# Patient Record
Sex: Female | Born: 1990 | Race: White | Hispanic: No | Smoking: Never smoker
Health system: Southern US, Community
[De-identification: ages and names within clinical notes are randomized; demographics above are authoritative.]

## PROBLEM LIST (undated history)

## (undated) DIAGNOSIS — K219 Gastro-esophageal reflux disease without esophagitis: Secondary | ICD-10-CM

## (undated) DIAGNOSIS — O139 Gestational [pregnancy-induced] hypertension without significant proteinuria, unspecified trimester: Secondary | ICD-10-CM

## (undated) DIAGNOSIS — A749 Chlamydial infection, unspecified: Secondary | ICD-10-CM

## (undated) DIAGNOSIS — I10 Essential (primary) hypertension: Secondary | ICD-10-CM

## (undated) DIAGNOSIS — I8390 Asymptomatic varicose veins of unspecified lower extremity: Secondary | ICD-10-CM

## (undated) DIAGNOSIS — Z9889 Other specified postprocedural states: Secondary | ICD-10-CM

## (undated) DIAGNOSIS — R112 Nausea with vomiting, unspecified: Secondary | ICD-10-CM

## (undated) HISTORY — DX: Chlamydial infection, unspecified: A74.9

## (undated) HISTORY — DX: Asymptomatic varicose veins of unspecified lower extremity: I83.90

## (undated) HISTORY — PX: WISDOM TOOTH EXTRACTION: SHX21

## (undated) HISTORY — DX: Gastro-esophageal reflux disease without esophagitis: K21.9

---
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
## 2012-09-20 DIAGNOSIS — O139 Gestational [pregnancy-induced] hypertension without significant proteinuria, unspecified trimester: Secondary | ICD-10-CM

## 2015-11-12 ENCOUNTER — Encounter: Payer: Self-pay | Admitting: Vascular Surgery

## 2015-11-12 ENCOUNTER — Other Ambulatory Visit: Payer: Self-pay | Admitting: *Deleted

## 2015-11-12 DIAGNOSIS — I83811 Varicose veins of right lower extremities with pain: Secondary | ICD-10-CM

## 2015-12-04 ENCOUNTER — Encounter (HOSPITAL_COMMUNITY): Payer: Medicaid Other

## 2015-12-04 ENCOUNTER — Encounter: Payer: Medicaid Other | Admitting: Vascular Surgery

## 2015-12-14 ENCOUNTER — Encounter: Payer: Self-pay | Admitting: Vascular Surgery

## 2015-12-18 ENCOUNTER — Ambulatory Visit (INDEPENDENT_AMBULATORY_CARE_PROVIDER_SITE_OTHER): Payer: Medicaid Other | Admitting: Vascular Surgery

## 2015-12-18 ENCOUNTER — Ambulatory Visit (HOSPITAL_COMMUNITY)
Admission: RE | Admit: 2015-12-18 | Discharge: 2015-12-18 | Disposition: A | Discharge status: Home / Self Care | Payer: Medicaid Other | Source: Ambulatory Visit | Attending: Vascular Surgery | Admitting: Vascular Surgery

## 2015-12-18 ENCOUNTER — Encounter: Payer: Self-pay | Admitting: Vascular Surgery

## 2015-12-18 VITALS — BP 130/92 | HR 76 | Ht 64.0 in | Wt 226.7 lb

## 2015-12-18 DIAGNOSIS — I872 Venous insufficiency (chronic) (peripheral): Secondary | ICD-10-CM

## 2015-12-18 DIAGNOSIS — I83811 Varicose veins of right lower extremities with pain: Secondary | ICD-10-CM | POA: Insufficient documentation

## 2015-12-18 DIAGNOSIS — I83893 Varicose veins of bilateral lower extremities with other complications: Secondary | ICD-10-CM

## 2015-12-18 DIAGNOSIS — I83899 Varicose veins of unspecified lower extremities with other complications: Secondary | ICD-10-CM | POA: Insufficient documentation

## 2015-12-18 NOTE — Progress Notes (Signed)
[facility]  Patient name: Sheila Jenkins MRN: [id number] DOB: 1991/05/11 Sex: female  REASON FOR CONSULT: Varicose veins  HPI: Sheila GaudierMontana Busche is a 25 y.o. female, who who presents for evaluation of right varicose veins. She states that these veins started approximately 4 years ago after her first pregnancy. She has had 2 children. She states that she's never had issues like this prior to her pregnancies. He describes a burning and itching sensation over her right anterior thigh where she has a large varicosity. The patient also states that she feels a sensation of warmth to this area as well. The patient is concerned that these veins areworsening. These symptoms interfere with her daily work and activity. She complains that her right leg gives out sometimes. She denies any prior history of DVT. She denies a family history of varicose vein issues.  The patient has no past medical history. She has never been a smoker.  Past Medical History  Diagnosis Date  . Varicose veins    Family History: patient is unable to detail the medical history of his parents  SOCIAL HISTORY: Social History   Social History  . Marital Status: Single    Spouse Name: N/A  . Number of Children: N/A  . Years of Education: N/A   Occupational History  . Not on file.   Social History Main Topics  . Smoking status: Never Smoker   . Smokeless tobacco: Not on file  . Alcohol Use: No  . Drug Use: No  . Sexual Activity: Not on file   Other Topics Concern  . Not on file   Social History Narrative    No Known Allergies  No current outpatient prescriptions on file.   No current facility-administered medications for this visit.    REVIEW OF SYSTEMS:  [X]  denotes positive finding, [ ]  denotes negative finding Cardiac  Comments:  Chest pain or chest pressure:    Shortness of breath upon exertion:    Short of breath when lying flat:    Irregular heart rhythm:          Vascular    Pain in calf, thigh, or hip brought on by ambulation:    Pain in feet at night that wakes you up from your sleep:     Blood clot in your veins:    Leg swelling:         Pulmonary    Oxygen at home:    Productive cough:     Wheezing:         Neurologic    Sudden weakness in arms or legs:     Sudden numbness in arms or legs:     Sudden onset of difficulty speaking or slurred speech:    Temporary loss of vision in one eye:     Problems with dizziness:         Gastrointestinal    Blood in stool:     Vomited blood:         Genitourinary    Burning when urinating:     Blood in urine:        Psychiatric    Major depression:         Hematologic    Bleeding problems:    Problems with blood clotting too easily:        Skin    Rashes or ulcers:        Constitutional    Fever or chills:  PHYSICAL EXAM: Filed Vitals:   12/18/15 1406  BP: 130/92  Pulse: 76  Height:  (1.626 m)  Weight: 226 lb 11.2 oz (102.83 kg)  SpO2: 100%    GENERAL: The patient is a well-nourished obese female, in no acute distress. The vital signs are documented above. CARDIAC: There is a regular rate and rhythm.  VASCULAR: Palpable radial and pedal pulses bilaterally. Tortuous, palpable varicosity to right anterior thigh. PULMONARY: There is good air exchange bilaterally without wheezing or rales..  MUSCULOSKELETAL: There are no major deformities or cyanosis. NEUROLOGIC: No focal weakness or paresthesias are detected. SKIN: There are no ulcers or rashes noted. PSYCHIATRIC: The patient has a normal affect. HEAD: [place]/AT EAR/NOSE/THROAT: Hearing grossly intact, nares without erythema or drainage, oropharynx without Erythema/Exudate, Mallampati score: 3 LYMPH:  No Cervical, Axillary, or Inguinal lymphadenopathy     DATA:  Right lower extremity venous duplex evaluation 12/18/2015  There is no evidence of DVT or superficial thrombophlebitis.   Reflux present in right common  femoral vein. Reflux at the saphenofemoral junction. Anterior branch off of saphenofemoral junction with reflux present. Diameter of 0.49 cm distal thigh to 0.51cm proximal thigh.  MEDICAL ISSUES: Varicose veins with complications  The patient reports significant discomfort with her right leg varicose veins. Will recommend conservative treatment with 20-30 mmHg thigh-high compression stockings, elevation and ibuprofen. Patient understands the need for a 3 month trial of compression stockings. If the patient fails conservative therapy, we'll have the patient evaluated for possible laser ablation of the great saphenous vein and/or stab phlebectomy of right thigh varicosities. She will follow up in 3 months with Dr. Arbie Cookey or Dr. Hart Rochester.  Maris Berger, PA-C [facility] [phone]  Addendum  I have independently interviewed and examined the patient, and I agree with the physician assistant's findings.  Pt has ASV > 4.5 mm.  I doubt all of her sx are related to her CVI but she does have some elements of CVI.  Will trial compression stockings and then have her follow up in the Vein Clinic in 3 months for evaluation for EVLA R ASV vs stab plebectomy vs stripping  Leonides Sake, MD [facility] Specialists of [place] Office: [phone] Pager: [phone]  12/18/2015, 3:11 PM

## 2016-03-16 ENCOUNTER — Encounter: Payer: Self-pay | Admitting: Vascular Surgery

## 2016-03-22 ENCOUNTER — Ambulatory Visit: Payer: Medicaid Other | Admitting: Vascular Surgery

## 2017-03-23 ENCOUNTER — Emergency Department (HOSPITAL_COMMUNITY)
Admission: EM | Admit: 2017-03-23 | Discharge: 2017-03-23 | Disposition: A | Discharge status: Home / Self Care | Payer: Medicaid Other | Attending: Emergency Medicine | Admitting: Emergency Medicine

## 2017-03-23 ENCOUNTER — Encounter (HOSPITAL_COMMUNITY): Payer: Self-pay | Admitting: *Deleted

## 2017-03-23 ENCOUNTER — Emergency Department (HOSPITAL_COMMUNITY): Payer: Medicaid Other

## 2017-03-23 DIAGNOSIS — O034 Incomplete spontaneous abortion without complication: Secondary | ICD-10-CM | POA: Insufficient documentation

## 2017-03-23 DIAGNOSIS — N939 Abnormal uterine and vaginal bleeding, unspecified: Secondary | ICD-10-CM

## 2017-03-23 DIAGNOSIS — N9489 Other specified conditions associated with female genital organs and menstrual cycle: Secondary | ICD-10-CM | POA: Insufficient documentation

## 2017-03-23 DIAGNOSIS — Z3A01 Less than 8 weeks gestation of pregnancy: Secondary | ICD-10-CM | POA: Insufficient documentation

## 2017-03-23 DIAGNOSIS — O209 Hemorrhage in early pregnancy, unspecified: Secondary | ICD-10-CM | POA: Diagnosis present

## 2017-03-23 LAB — WET PREP, GENITAL
Sperm: NONE SEEN
TRICH WET PREP: NONE SEEN
YEAST WET PREP: NONE SEEN

## 2017-03-23 LAB — URINALYSIS, ROUTINE W REFLEX MICROSCOPIC
BACTERIA UA: NONE SEEN
Bilirubin Urine: NEGATIVE
Glucose, UA: NEGATIVE mg/dL
KETONES UR: 20 mg/dL — AB
Nitrite: NEGATIVE
PROTEIN: NEGATIVE mg/dL
Specific Gravity, Urine: 1.009 (ref 1.005–1.030)
pH: 5 (ref 5.0–8.0)

## 2017-03-23 LAB — HCG, QUANTITATIVE, PREGNANCY: hCG, Beta Chain, Quant, S: 1743 m[IU]/mL — ABNORMAL HIGH (ref <5)

## 2017-03-23 NOTE — ED Triage Notes (Signed)
Pt states she took a positive pregnancy test in the first part of June because she missed her period in April. Starting yesterday she had vaginal bleeding. She denies any pain. She has not been to an OBGYN.

## 2017-03-23 NOTE — ED Provider Notes (Signed)
[facility]-EMER[facility]ENCY DEPT Provider Note   CSN: [id number] Arrival date & time: 03/23/17  1256     History   Chief Complaint Chief Complaint  Patient presents with  . Vaginal Bleeding    HPI Sheila Jenkins is a 26 y.o. female.   Vaginal Bleeding  Primary symptoms include vaginal bleeding.  Primary symptoms include no discharge, no pelvic pain, no dysuria. There has been no fever. This is a new problem. The current episode started yesterday. The problem occurs constantly. The problem has not changed since onset.The symptoms occur during urination. She is pregnant. She has missed her period. Her LMP was months ago. Pertinent negatives include no anorexia and no diarrhea.    Past Medical History:  Diagnosis Date  . Varicose veins     Patient Active Problem List   Diagnosis Date Noted  . 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 Chronic venous insufficiency 12/18/2015    History reviewed. No pertinent surgical history.  OB History    No data available       Home Medications    Prior to Admission medications   Not on File    Family History No family history on file.  Social History Social History  Substance Use Topics  . Smoking status: Never Smoker  . Smokeless tobacco: Never Used  . Alcohol use No     Allergies   Patient has no known allergies.   Review of Systems Review of Systems  Gastrointestinal: Negative for anorexia and diarrhea.  Genitourinary: Positive for vaginal bleeding. Negative for dysuria and pelvic pain.  All other systems reviewed and are negative.    Physical Exam Updated Vital Signs BP (!) 145/87 (BP Location: Right Arm)   Pulse 70   Temp 98.3 F (36.8 C) (Oral)   Resp 18   Ht 5\' 4"  (1.626 m)   Wt 95.3 kg (210 lb)   LMP 01/13/2017   SpO2 96%   BMI 36.05 kg/m   Physical Exam  Constitutional: She is oriented to person, place, and time. She appears well-developed and well-nourished.  HENT:    Head: Normocephalic and atraumatic.  Eyes: Conjunctivae and EOM are normal.  Neck: Normal range of motion.  Cardiovascular: Normal rate and regular rhythm.   Pulmonary/Chest: Effort normal and breath sounds normal. No stridor. No respiratory distress.  Abdominal: Soft. Bowel sounds are normal. She exhibits no distension. There is no tenderness. There is no guarding.  Genitourinary: Right adnexum displays no mass and no tenderness. Left adnexum displays no mass and no tenderness. There is bleeding in the vagina.  Genitourinary Comments: Os closed Vaginal bleeding No discharge  Musculoskeletal: Normal range of motion. She exhibits no edema or deformity.  Neurological: She is alert and oriented to person, place, and time. No cranial nerve deficit. Coordination normal.  Skin: Skin is warm and dry.  Nursing note and vitals reviewed.    ED Treatments / Results  Labs (all labs ordered are listed, but only abnormal results are displayed) Labs Reviewed  WET PREP, GENITAL  HCG, QUANTITATIVE, PREGNANCY  URINALYSIS, ROUTINE W REFLEX MICROSCOPIC  GC/CHLAMYDIA PROBE AMP ([place]) NOT AT [facility]    EKG  EKG Interpretation None       Radiology No results found.  Procedures Procedures (including critical care time)  Medications Ordered in ED Medications - No data to display   Initial Impression / Assessment and Plan / ED Course  I have reviewed the triage vital signs and the nursing notes.  Pertinent labs & imaging results that were available during my care of the patient were reviewed by me and considered in my medical decision making (see chart for details).     26 yo F w/ likely incomplete abortion, but will follow up with ob for trending of hcg and repeat US.   Final Clinical Impressions(s) / ED Diagnoses   Final diagnoses:  Vaginal bleeding  Incomplete miscarriage    New Prescriptions There are no discharge medications for this patient.    Adah Stoneberg, Barbara Cower,  MD 03/25/17 [phone]

## 2017-03-24 LAB — GC/CHLAMYDIA PROBE AMP (~~LOC~~) NOT AT ARMC
Chlamydia: POSITIVE — AB
NEISSERIA GONORRHEA: NEGATIVE

## 2017-03-28 ENCOUNTER — Telehealth: Payer: Self-pay | Admitting: Obstetrics & Gynecology

## 2017-03-28 ENCOUNTER — Encounter: Payer: Self-pay | Admitting: Obstetrics & Gynecology

## 2017-03-28 ENCOUNTER — Ambulatory Visit (INDEPENDENT_AMBULATORY_CARE_PROVIDER_SITE_OTHER): Payer: Medicaid Other | Admitting: Obstetrics & Gynecology

## 2017-03-28 ENCOUNTER — Other Ambulatory Visit: Payer: Self-pay | Admitting: Obstetrics & Gynecology

## 2017-03-28 VITALS — BP 122/84 | HR 82 | Ht 64.0 in | Wt 210.0 lb

## 2017-03-28 DIAGNOSIS — O039 Complete or unspecified spontaneous abortion without complication: Secondary | ICD-10-CM | POA: Diagnosis not present

## 2017-03-28 MED ORDER — AZITHROMYCIN 500 MG PO TABS: 1000.0000 mg | ORAL_TABLET | Freq: Every day | ORAL | 1 refills | Status: DC

## 2017-03-28 NOTE — Telephone Encounter (Signed)
Patient came into the office today to see Dr. Despina HiddenEure, She states that Dr. Despina HiddenEure was suppose to prescribe her a medication for chlamydia for her and her partner. I looked in patient charts and it shows that he has sent it to [facility] but pt states they don't have it. Please resent medication.

## 2017-03-28 NOTE — Telephone Encounter (Signed)
Pt stated pharmacy did not receive the prescription that Dr. Despina HiddenEure had prescribed. I spoke with [facility] who stated that the prescription had been filled and picked up.

## 2017-03-28 NOTE — Progress Notes (Signed)
      Chief Complaint  Patient presents with  . Follow-up    ED visit; possible miscarriage    Blood pressure 122/84, pulse 82, height 5\' 4"  (1.626 m), weight 210 lb (95.3 kg), last menstrual period 01/13/2017.  26 y.o. No obstetric history on file. Patient's last menstrual period was 01/13/2017. The current method of family planning is none.  Outpatient Encounter Prescriptions as of 03/28/2017  Medication Sig  . azithromycin (ZITHROMAX) 500 MG tablet Take 2 tablets (1,000 mg total) by mouth daily.   No facility-administered encounter medications on file as of 03/28/2017.     Subjective Patient is seen in follow-up from emergency department Her last period was 01/13/2017 She presented to the ED with a positive pregnancy test and heavy vaginal bleeding like a heavy period with appropriate cramping Sonogram essentially reveals an empty uterus and no adnexal masses a very small sac in the lower uterine segment HCG was 1758 Since she was seen in the ED her bleeding is largely resolved as is her cramping and she's had no other passage of tissue that she is aware of  Objective   Pertinent ROS No burning with urination, frequency or urgency No nausea, vomiting or diarrhea Nor fever chills or other constitutional symptoms   Labs or studies Per HPI, from ED sonogram reviewed    Impression Diagnoses this Encounter::   ICD-10-CM   1. Spontaneous miscarriage O03.9 hCG, quantitative, pregnancy    Established relevant diagnosis(es):   Plan/Recommendations: Meds ordered this encounter  Medications  . azithromycin (ZITHROMAX) 500 MG tablet    Sig: Take 2 tablets (1,000 mg total) by mouth daily.    Dispense:  2 tablet    Refill:  1    Please provide refill for partner    Labs or Scans Ordered: Orders Placed This Encounter  Procedures  . hCG, quantitative, pregnancy    Management:: Repeat her hCG today just to make sure is following appropriately and give her a call  when that result returns in follow-up based on that result This likely is a completed spontaneous miscarriage all her questions were answered including future pregnancy issues  Follow up Return if symptoms worsen or fail to improve.        Face to face time:  15 minutes  Greater than 50% of the visit time was spent in counseling and coordination of care with the patient.  The summary and outline of the counseling and care coordination is summarized in the note above.   All questions were answered.  Past Medical History:  Diagnosis Date  . Varicose veins     Past Surgical History:  Procedure Laterality Date  . WISDOM TOOTH EXTRACTION      OB History    No data available      No Known Allergies  Social History   Social History  . Marital status: Single    Spouse name: N/A  . Number of children: N/A  . Years of education: N/A   Social History Main Topics  . Smoking status: Never Smoker  . Smokeless tobacco: Never Used  . Alcohol use No  . Drug use: No  . Sexual activity: Yes    Birth control/ protection: None   Other Topics Concern  . None   Social History Narrative  . None    Family History  Problem Relation Age of Onset  . Diabetes Maternal Grandmother   . Diabetes Maternal Grandfather

## 2017-03-29 LAB — BETA HCG QUANT (REF LAB): HCG QUANT: 88 m[IU]/mL

## 2017-05-19 ENCOUNTER — Telehealth: Payer: Self-pay | Admitting: Obstetrics & Gynecology

## 2017-05-19 ENCOUNTER — Encounter: Payer: Self-pay | Admitting: Obstetrics & Gynecology

## 2017-05-19 NOTE — Telephone Encounter (Signed)
Pt called stating that she had a miscarriage at the end of June and has not started her period yet. Took a home pregnancy test and it was negative. Advised pt to give it a little more time and to call us back if she had not started a period after 3 months. Pt verbalized understanding.

## 2017-07-18 ENCOUNTER — Telehealth: Payer: Self-pay | Admitting: *Deleted

## 2017-07-18 NOTE — Telephone Encounter (Signed)
Spoke with pt. Pt had a miscarriage in June. She started bleeding 9/11. Stopped then started bleeding again 9/26 for 1 week. On 10/20, she started bleeding heavy and passing a mucus discharge. Also having cramps. I advised she needs to be seen. Pt voiced understanding and call was transferred to front desk for appt. JSY

## 2017-07-19 ENCOUNTER — Encounter: Payer: Self-pay | Admitting: Adult Health

## 2017-07-19 ENCOUNTER — Ambulatory Visit (INDEPENDENT_AMBULATORY_CARE_PROVIDER_SITE_OTHER): Payer: Medicaid Other | Admitting: Adult Health

## 2017-07-19 VITALS — BP 136/84 | HR 87 | Ht 64.0 in | Wt 211.0 lb

## 2017-07-19 DIAGNOSIS — R109 Unspecified abdominal pain: Secondary | ICD-10-CM

## 2017-07-19 DIAGNOSIS — Z8759 Personal history of other complications of pregnancy, childbirth and the puerperium: Secondary | ICD-10-CM

## 2017-07-19 DIAGNOSIS — N921 Excessive and frequent menstruation with irregular cycle: Secondary | ICD-10-CM

## 2017-07-19 DIAGNOSIS — Z8619 Personal history of other infectious and parasitic diseases: Secondary | ICD-10-CM | POA: Diagnosis not present

## 2017-07-19 LAB — POCT URINALYSIS DIPSTICK
Leukocytes, UA: NEGATIVE
NITRITE UA: NEGATIVE
RBC UA: POSITIVE

## 2017-07-19 LAB — POCT URINE PREGNANCY: Preg Test, Ur: NEGATIVE

## 2017-07-19 MED ORDER — DOXYCYCLINE HYCLATE 100 MG PO TABS
100.0000 mg | ORAL_TABLET | Freq: Two times a day (BID) | ORAL | 0 refills | Status: DC
Start: 2017-07-19 — End: 2017-08-21

## 2017-07-19 NOTE — Progress Notes (Signed)
Subjective:     Patient ID: Sheila Jenkins, female   DOB: 12/07/1990, 26 y.o.   MRN: [id number]  HPI Sheila Jenkins is a 26 year old white female, in complaining of heavy bleeding and mucous discharge since 10/20.She also complains of headache almost daily since 10/20 and decreased appetite and breast tenderness and is tired. She had miscarriage end of June and was also treated for chlamydia in July. She had bleeding 9/11 and again 9/26 for about a week each time. And has cramps.She is sexually active with one partner and desires to be pregnant. QHCG was 88 on 03/28/17.  Review of Systems Bleeding heavy with mucous discharge +breast tenderness +headache almost daily since 10/20 +tired +cramps  Decreased appetite  Reviewed past medical,surgical, social and family history. Reviewed medications and allergies.      Objective:   Physical Exam BP 136/84 (BP Location: Right Arm, Patient Position: Sitting, Cuff Size: Normal)   Pulse 87   Ht 5' 4" (1.626 m)   Wt 211 lb (95.7 kg)   LMP 07/15/2017   BMI 36.22 kg/m UPT negative, urine dipstick +blood trace protein, Skin warm and dry. Neck: mid line trachea, normal thyroid, good ROM, no lymphadenopathy noted. Lungs: clear to ausculation bilaterally. Cardiovascular: regular rate and rhythm. .Pelvic: external genitalia is normal in appearance no lesions, vagina: period like blood,urethra has no lesions or masses noted, cervix:smooth and bulbous,no CMT, uterus: normal size, shape and contour, mildly tender, no masses felt, adnexa: no masses or tenderness noted. Bladder is non tender and no masses felt. GC/CHL obtained.     Assessment:     1. Menorrhagia with irregular cycle   2. Abdominal cramps   3. History of miscarriage   4. History of chlamydia       Plan:    Ok to take tylenol or advil for cramps  Meds ordered this encounter  Medications  . doxycycline (VIBRA-TABS) 100 MG tablet    Sig: Take 1 tablet (100 mg total) by mouth 2 (two) times  daily.    Dispense:  28 tablet    Refill:  0    Order Specific Question:   Supervising Provider    Answer:   Tania Ade H [[id number]]  Check CBC,CMP,TSH,ESR and QHCG GC/CHL sent Follow up in 2 days

## 2017-07-20 LAB — COMPREHENSIVE METABOLIC PANEL
ALBUMIN: 4.7 g/dL (ref 3.5–5.5)
ALK PHOS: 51 IU/L (ref 39–117)
ALT: 16 IU/L (ref 0–32)
AST: 14 IU/L (ref 0–40)
Albumin/Globulin Ratio: 1.8 (ref 1.2–2.2)
BUN / CREAT RATIO: 17 (ref 9–23)
BUN: 11 mg/dL (ref 6–20)
Bilirubin Total: 0.3 mg/dL (ref 0.0–1.2)
CO2: 25 mmol/L (ref 20–29)
CREATININE: 0.66 mg/dL (ref 0.57–1.00)
Calcium: 9.3 mg/dL (ref 8.7–10.2)
Chloride: 104 mmol/L (ref 96–106)
GFR calc Af Amer: 143 mL/min/{1.73_m2} (ref >59)
GFR, EST NON AFRICAN AMERICAN: 124 mL/min/{1.73_m2} (ref >59)
GLOBULIN, TOTAL: 2.6 g/dL (ref 1.5–4.5)
GLUCOSE: 88 mg/dL (ref 65–99)
Potassium: 4.5 mmol/L (ref 3.5–5.2)
Sodium: 141 mmol/L (ref 134–144)
TOTAL PROTEIN: 7.3 g/dL (ref 6.0–8.5)

## 2017-07-20 LAB — CBC
Hematocrit: 39.6 % (ref 34.0–46.6)
Hemoglobin: 12.6 g/dL (ref 11.1–15.9)
MCH: 28.9 pg (ref 26.6–33.0)
MCHC: 31.8 g/dL (ref 31.5–35.7)
MCV: 91 fL (ref 79–97)
PLATELETS: 371 10*3/uL (ref 150–379)
RBC: 4.36 x10E6/uL (ref 3.77–5.28)
RDW: 12.9 % (ref 12.3–15.4)
WBC: 4.9 10*3/uL (ref 3.4–10.8)

## 2017-07-20 LAB — BETA HCG QUANT (REF LAB)

## 2017-07-20 LAB — TSH: TSH: 1.36 u[IU]/mL (ref 0.450–4.500)

## 2017-07-20 LAB — SEDIMENTATION RATE: Sed Rate: 2 mm/hr (ref 0–32)

## 2017-07-21 ENCOUNTER — Encounter: Payer: Self-pay | Admitting: Adult Health

## 2017-07-21 ENCOUNTER — Ambulatory Visit (INDEPENDENT_AMBULATORY_CARE_PROVIDER_SITE_OTHER): Payer: Medicaid Other | Admitting: Adult Health

## 2017-07-21 VITALS — BP 128/82 | HR 71 | Ht 64.0 in | Wt 212.0 lb

## 2017-07-21 DIAGNOSIS — R109 Unspecified abdominal pain: Secondary | ICD-10-CM

## 2017-07-21 NOTE — Patient Instructions (Signed)
F/u in 4 weeks for pap and physical

## 2017-07-21 NOTE — Progress Notes (Signed)
Subjective:     Patient ID: Sheila GaudierMontana Jenkins, female   DOB: 16-Dec-1990, 26 y.o.   MRN: [id number]  HPI Sheila Jenkins is  26 year old white female back in follow up io being seen 2 days ago for  Heavy bleeding and camps.Labs were all normal and she says bleeding stopped and no cramps today and feels so much better.   Review of Systems Bleeding stopped Cramping much better, none today Teary at times Has trouble sleeping Reviewed past medical,surgical, social and family history. Reviewed medications and allergies.     Objective:   Physical Exam BP 128/82 (BP Location: Right Arm, Patient Position: Sitting, Cuff Size: Normal)   Pulse 71   Ht 5\' 4"  (1.626 m)   Wt 212 lb (96.2 kg)   LMP 07/15/2017   BMI 36.39 kg/m Skin warm and dry, abdomen is soft and non tender and she is smiling today. PHQ 2 score 0.    Assessment:     Abdominal cramping much better     Plan:     Finish doxycycline Ok to use melatonin  Return in 4 weeks for pap and physical

## 2017-07-23 LAB — GC/CHLAMYDIA PROBE AMP
CHLAMYDIA, DNA PROBE: NEGATIVE
NEISSERIA GONORRHOEAE BY PCR: NEGATIVE

## 2017-08-21 ENCOUNTER — Encounter: Payer: Self-pay | Admitting: Adult Health

## 2017-08-21 ENCOUNTER — Other Ambulatory Visit (HOSPITAL_COMMUNITY)
Admission: RE | Admit: 2017-08-21 | Discharge: 2017-08-21 | Disposition: A | Discharge status: Home / Self Care | Payer: Medicaid Other | Source: Ambulatory Visit | Attending: Adult Health | Admitting: Adult Health

## 2017-08-21 ENCOUNTER — Ambulatory Visit: Payer: Medicaid Other | Admitting: Adult Health

## 2017-08-21 VITALS — BP 126/90 | HR 70 | Ht 64.0 in | Wt 212.0 lb

## 2017-08-21 DIAGNOSIS — Z0001 Encounter for general adult medical examination with abnormal findings: Secondary | ICD-10-CM | POA: Diagnosis not present

## 2017-08-21 DIAGNOSIS — Z01419 Encounter for gynecological examination (general) (routine) without abnormal findings: Secondary | ICD-10-CM | POA: Insufficient documentation

## 2017-08-21 DIAGNOSIS — Z3202 Encounter for pregnancy test, result negative: Secondary | ICD-10-CM

## 2017-08-21 DIAGNOSIS — N926 Irregular menstruation, unspecified: Secondary | ICD-10-CM

## 2017-08-21 DIAGNOSIS — Z01411 Encounter for gynecological examination (general) (routine) with abnormal findings: Secondary | ICD-10-CM

## 2017-08-21 DIAGNOSIS — R5383 Other fatigue: Secondary | ICD-10-CM | POA: Diagnosis not present

## 2017-08-21 LAB — POCT URINE PREGNANCY: PREG TEST UR: NEGATIVE

## 2017-08-21 NOTE — Progress Notes (Signed)
Patient ID: Sheila Jenkins, female   DOB: [birth year], 26 y.o.   MRN: [id number] History of Present Illness: Sheila Jenkins is a 26 year old white female in for a well woman gyn exam and pap.She has missed a period and feels tired with some breast tenderness.She had normal labs in October. She says she is working out more.    Current Medications, Allergies, Past Medical History, Past Surgical History, Family History and Social History were reviewed in [facility] electronic medical record.     Review of Systems:  Patient denies any headaches, hearing loss,  blurred vision, shortness of breath, chest pain, abdominal pain, problems with bowel movements, urination, or intercourse. No joint pain or mood swings. See HPI for positives.   Physical Exam:BP 126/90 (BP Location: Left Arm, Patient Position: Sitting, Cuff Size: Large)   Pulse 70   Ht 5\' 4"  (1.626 m)   Wt 212 lb (96.2 kg)   LMP 07/16/2017   BMI 36.39 kg/m  General:  Well developed, well nourished, no acute distress Skin:  Warm and dry Neck:  Midline trachea, normal thyroid, good ROM, no lymphadenopathy Lungs; Clear to auscultation bilaterally Breast:  No dominant palpable mass, retraction, or nipple discharge Cardiovascular: Regular rate and rhythm Abdomen:  Soft, non tender, no hepatosplenomegaly Pelvic:  External genitalia is normal in appearance, no lesions.  The vagina is normal in appearance. Urethra has no lesions or masses. The cervix is bulbous. Thin prep pap with reflex HPV and GC/Chlamydia performed. Uterus is felt to be normal size, shape, and contour.  No adnexal masses or tenderness noted.Bladder is non tender, no masses felt. Extremities/musculoskeletal:  No swelling or varicosities noted, no clubbing or cyanosis Psych:  No mood changes, alert and cooperative,seems happy PHQ 2 score 0.  Impression:  1. Well woman exam with routine gynecological exam   2. Missed period   3. Tired   4. Pregnancy examination or test,  negative result      Plan: Take PNV with folic acid Call with next period, will check progesterone level, or if no period call Physical in 1 year Pap in 3 if normal

## 2017-08-22 LAB — CYTOLOGY - PAP
Chlamydia: NEGATIVE
Diagnosis: NEGATIVE
NEISSERIA GONORRHEA: NEGATIVE

## 2017-08-31 ENCOUNTER — Ambulatory Visit: Payer: Medicaid Other | Admitting: Adult Health

## 2017-08-31 ENCOUNTER — Encounter: Payer: Self-pay | Admitting: Adult Health

## 2017-08-31 VITALS — BP 130/90 | HR 80 | Ht 63.0 in | Wt 212.0 lb

## 2017-08-31 DIAGNOSIS — Z3169 Encounter for other general counseling and advice on procreation: Secondary | ICD-10-CM | POA: Diagnosis not present

## 2017-08-31 DIAGNOSIS — Z319 Encounter for procreative management, unspecified: Secondary | ICD-10-CM

## 2017-08-31 DIAGNOSIS — N946 Dysmenorrhea, unspecified: Secondary | ICD-10-CM | POA: Diagnosis not present

## 2017-08-31 LAB — POCT URINE PREGNANCY: Preg Test, Ur: NEGATIVE

## 2017-08-31 MED ORDER — IBUPROFEN 800 MG PO TABS: 800.0000 mg | ORAL_TABLET | Freq: Three times a day (TID) | ORAL | 1 refills | Status: DC | PRN

## 2017-08-31 MED ORDER — PRENATAL PLUS 27-1 MG PO TABS: 1.0000 | ORAL_TABLET | Freq: Every day | ORAL | 12 refills | Status: DC

## 2017-08-31 NOTE — Progress Notes (Signed)
Subjective:     Patient ID: Sheila GaudierMontana Jenkins, female   DOB: January 21, 1991, 26 y.o.   MRN: [id number]  HPI Sheila Jenkins is a 26 year old white female in complaining of cramps and clots, missed November period.She would like to be pregnant, she had miscarriage in June.  Review of Systems +cramps +clots Reviewed past medical,surgical, social and family history. Reviewed medications and allergies.     Objective:   Physical Exam BP 130/90 (BP Location: Left Arm, Patient Position: Sitting, Cuff Size: Large)   Pulse 80   Ht 5\' 3"  (1.6 m)   Wt 212 lb (96.2 kg)   LMP 08/30/2017   BMI 37.55 kg/m UPT negative, Skin warm and dry.Pelvic: external genitalia is normal in appearance no lesions, vagina: +period blood,urethra has no lesions or masses noted, cervix:smooth and bulbous, uterus: normal size, shape and contour,mildly tender, no masses felt, adnexa: no masses or tenderness noted. Bladder is non tender and no masses felt.     Assessment:     1. Menstrual cramps   2. Patient desires pregnancy       Plan:     Meds ordered this encounter  Medications  . prenatal vitamin w/FE, FA (PRENATAL 1 + 1) 27-1 MG TABS tablet    Sig: Take 1 tablet by mouth daily at 12 noon.    Dispense:  30 each    Refill:  12    Order Specific Question:   Supervising Provider    Answer:   Despina HiddenEURE, LUTHER H [[id number]]  . ibuprofen (ADVIL,MOTRIN) 800 MG tablet    Sig: Take 1 tablet (800 mg total) by mouth every 8 (eight) hours as needed.    Dispense:  60 tablet    Refill:  1    Order Specific Question:   Supervising Provider    Answer:   Lazaro ArmsEURE, LUTHER H [[id number]]  Check progesterone level 12/26 F/U prn

## 2017-11-09 ENCOUNTER — Other Ambulatory Visit: Payer: Self-pay | Admitting: *Deleted

## 2017-11-09 DIAGNOSIS — I83811 Varicose veins of right lower extremities with pain: Secondary | ICD-10-CM

## 2017-11-24 HISTORY — PX: VARICOSE VEIN SURGERY: SHX832

## 2017-12-12 ENCOUNTER — Ambulatory Visit (INDEPENDENT_AMBULATORY_CARE_PROVIDER_SITE_OTHER): Payer: Medicaid Other | Admitting: Vascular Surgery

## 2017-12-12 ENCOUNTER — Encounter: Payer: Self-pay | Admitting: Vascular Surgery

## 2017-12-12 ENCOUNTER — Other Ambulatory Visit: Payer: Self-pay | Admitting: *Deleted

## 2017-12-12 ENCOUNTER — Ambulatory Visit (HOSPITAL_COMMUNITY)
Admission: RE | Admit: 2017-12-12 | Discharge: 2017-12-12 | Disposition: A | Discharge status: Home / Self Care | Payer: Medicaid Other | Source: Ambulatory Visit | Attending: Vascular Surgery | Admitting: Vascular Surgery

## 2017-12-12 VITALS — BP 111/76 | HR 63 | Temp 97.7°F | Resp 16 | Ht 64.0 in | Wt 202.0 lb

## 2017-12-12 DIAGNOSIS — I872 Venous insufficiency (chronic) (peripheral): Secondary | ICD-10-CM

## 2017-12-12 DIAGNOSIS — I83811 Varicose veins of right lower extremities with pain: Secondary | ICD-10-CM | POA: Diagnosis not present

## 2017-12-12 DIAGNOSIS — R609 Edema, unspecified: Secondary | ICD-10-CM | POA: Diagnosis present

## 2017-12-12 DIAGNOSIS — I83893 Varicose veins of bilateral lower extremities with other complications: Secondary | ICD-10-CM | POA: Diagnosis not present

## 2017-12-12 NOTE — Progress Notes (Signed)
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 female here today for continued follow-up.  She has been extremely compliant with thigh-high graduated compression garments.  This initially was improving satisfactory for alleviating the pain that she has over the large varicosities over her anterior thigh and lateral calf.  She reports that she is now having progressive discomfort despite elevation compression and ibuprofen use.  Makes her difficult for her to care for her child.  She is here today for further evaluation and discussion of treatment.  She has had no history of DVT  Past Medical History:  Diagnosis Date  . Varicose veins     Family History  Problem Relation Age of Onset  . Diabetes Maternal Grandmother   . Diabetes Maternal Grandfather     SOCIAL HISTORY: Social History   Tobacco Use  . Smoking status: Never Smoker  . Smokeless tobacco: Never Used  Substance Use Topics  . Alcohol use: No    Alcohol/week: 0.0 oz    No Known Allergies  Current Outpatient Medications  Medication Sig Dispense Refill  . ibuprofen (ADVIL,MOTRIN) 800 MG tablet Take 1 tablet (800 mg total) by mouth every 8 (eight) hours as needed. (Patient not taking: Reported on 12/12/2017) 60 tablet 1  . prenatal vitamin w/FE, FA (PRENATAL 1 + 1) 27-1 MG TABS tablet Take 1 tablet by mouth daily at 12 noon. (Patient not taking: Reported on 12/12/2017) 30 each 12   No current facility-administered medications for this visit.     REVIEW OF SYSTEMS:  [X]  denotes positive finding, [ ]  denotes negative finding Cardiac  Comments:  Chest pain or chest pressure:    Shortness of breath upon exertion:    Short of breath when lying flat:    Irregular heart rhythm:        Vascular      Pain in calf, thigh, or hip brought on by ambulation:    Pain in feet at night that wakes you up from your sleep:     Blood clot in your veins:    Leg swelling:  x         PHYSICAL EXAM: Vitals:   12/12/17 1138  BP: 111/76  Pulse: 63  Resp: 16  Temp: 97.7 F (36.5 C)  SpO2: 100%  Weight: 202 lb (91.6 kg)  Height: 5\' 4"  (1.626 m)    GENERAL: The patient is a well-nourished female, in no acute distress. The vital signs are documented above. CARDIOVASCULAR: 2+ dorsalis pedis pulses bilaterally.  No varicosities in her left leg.  She has large varicosities extending from the medial upper thigh across her anterior thigh and down the lateral knee and calf. PULMONARY: There is good air exchange  MUSCULOSKELETAL: There are no major deformities or cyanosis. NEUROLOGIC: No focal weakness or paresthesias are detected. SKIN: There are no ulcers or rashes noted. PSYCHIATRIC: The patient has a normal affect.  DATA:  Reflux in her anterior accessory saphenous vein on the right with dilatation and extension into the large varicosities extending throughout her thigh.  I imaged her anterior accessory branch of her great saphenous vein with SonoSite ultrasound.  This is enlarged throughout its course and does have a very straight path out to the mid thigh.  From there it does feed into these large varicosities extending through the rest of  her leg  MEDICAL ISSUES: The patient has been very diligent in her conservative treatment and this is initially successful but is now failed to alleviate her symptoms.  I have recommended laser ablation of her accessory saphenous vein on the right and stab phlebectomy of greater than 20 tributary varicosities throughout the remaining portion of her thigh.  Explained that she would have to have compression garment usage for 48 hours around-the-clock following the procedure and then daytime only for 2 weeks.  I did explain the very slight risk of DVT associated with  the procedure.  She understands and wishes to proceed for symptom relief.    Larina Earthly, MD FACS [facility] Tel [phone] Pager [phone]

## 2017-12-21 ENCOUNTER — Ambulatory Visit: Payer: Medicaid Other | Admitting: Vascular Surgery

## 2017-12-21 ENCOUNTER — Encounter: Payer: Self-pay | Admitting: Vascular Surgery

## 2017-12-21 VITALS — BP 126/89 | HR 64 | Temp 98.0°F | Resp 16 | Ht 64.0 in | Wt 199.0 lb

## 2017-12-21 DIAGNOSIS — I83811 Varicose veins of right lower extremities with pain: Secondary | ICD-10-CM | POA: Diagnosis not present

## 2017-12-21 NOTE — Progress Notes (Signed)
     Laser Ablation Procedure    Date: 12/21/2017   [facility]12-13-1990  Consent signed: Yes    Surgeon:  Dr. Tawanna Coolerodd Kerri-Anne Haeberle  Procedure: Laser Ablation: right Greater Saphenous Vein  BP 126/89   Pulse 64   Temp 98 F (36.7 C)   Resp 16   Ht 5\' 4"  (1.626 m)   Wt 199 lb (90.3 kg)   SpO2 99%   BMI 34.16 kg/m   Tumescent Anesthesia: 400 cc 0.9% NaCl with 50 cc Lidocaine HCL with 1% Epi and 15 cc 8.4% NaHCO3  Local Anesthesia: 5 cc Lidocaine HCL and NaHCO3 (ratio 2:1)  15 watts continuous mode        Total energy: 735   Total time: :48    Stab Phlebectomy: >20 Sites: Thigh and Calf  Patient tolerated procedure well  Notes:   Description of Procedure:  After marking the course of the secondary varicosities, the patient was placed on the operating table in the supine position, and the right leg was prepped and draped in sterile fashion.   Local anesthetic was administered and under ultrasound guidance the saphenous vein was accessed with a micro needle and guide wire; then the mirco puncture sheath was placed.  A guide wire was inserted saphenofemoral junction , followed by a 5 french sheath.  The position of the sheath and then the laser fiber below the junction was confirmed using the ultrasound.  Tumescent anesthesia was administered along the course of the saphenous vein using ultrasound guidance. The patient was placed in Trendelenburg position and protective laser glasses were placed on patient and staff, and the laser was fired at 15 watts continuous mode advancing 1-202mm/second for a total of 735 joules.   For stab phlebectomies, local anesthetic was administered at the previously marked varicosities, and tumescent anesthesia was administered around the vessels.  Greater than 20 stab wounds were made using the tip of an 11 blade. And using the vein hook, the phlebectomies were performed using a hemostat to avulse the varicosities.  Adequate hemostasis was achieved.      Steri strips were applied to the stab wounds and ABD pads and thigh high compression stockings were applied.  Ace wrap bandages were applied over the phlebectomy sites and at the top of the saphenofemoral junction. Blood loss was less than 15 cc.  The patient ambulated out of the operating room having tolerated the procedure well.  Ablation of her anterior accessory great saphenous vein from her proximal thigh to saphenous femoral junction.  Stab phlebectomy of multiple varicosities throughout her thigh and calf.  Follow-up 1 week

## 2017-12-22 ENCOUNTER — Encounter: Payer: Self-pay | Admitting: Vascular Surgery

## 2017-12-28 ENCOUNTER — Other Ambulatory Visit: Payer: Self-pay

## 2017-12-28 ENCOUNTER — Encounter: Payer: Self-pay | Admitting: Vascular Surgery

## 2017-12-28 ENCOUNTER — Ambulatory Visit (HOSPITAL_COMMUNITY)
Admission: RE | Admit: 2017-12-28 | Discharge: 2017-12-28 | Disposition: A | Discharge status: Home / Self Care | Payer: Medicaid Other | Source: Ambulatory Visit | Attending: Vascular Surgery | Admitting: Vascular Surgery

## 2017-12-28 ENCOUNTER — Ambulatory Visit (INDEPENDENT_AMBULATORY_CARE_PROVIDER_SITE_OTHER): Payer: Medicaid Other | Admitting: Vascular Surgery

## 2017-12-28 VITALS — BP 130/86 | HR 68 | Resp 18 | Ht 64.0 in | Wt 199.0 lb

## 2017-12-28 DIAGNOSIS — I83811 Varicose veins of right lower extremities with pain: Secondary | ICD-10-CM | POA: Insufficient documentation

## 2017-12-28 DIAGNOSIS — Z9889 Other specified postprocedural states: Secondary | ICD-10-CM | POA: Insufficient documentation

## 2017-12-28 NOTE — Progress Notes (Signed)
   Patient name: Sheila Jenkins MRN: [id number] DOB: 07/06/1991 Sex: female  REASON FOR VISIT: Follow-up laser ablation right great saphenous vein and stab phlebectomy of multiple tributary varicosities over the thigh and calf  HPI: Sheila Jenkins is a 27 y.o. female here today for follow-up.  She has been compliant with her compression.  Reports minimal discomfort associated with this.  Current Outpatient Medications  Medication Sig Dispense Refill  . ibuprofen (ADVIL,MOTRIN) 800 MG tablet Take 1 tablet (800 mg total) by mouth every 8 (eight) hours as needed. (Patient not taking: Reported on 12/12/2017) 60 tablet 1  . prenatal vitamin w/FE, FA (PRENATAL 1 + 1) 27-1 MG TABS tablet Take 1 tablet by mouth daily at 12 noon. (Patient not taking: Reported on 12/12/2017) 30 each 12   No current facility-administered medications for this visit.      PHYSICAL EXAM: Vitals:   12/28/17 1015  BP: 130/86  Pulse: 68  Resp: 18  SpO2: 100%  Weight: 199 lb (90.3 kg)  Height: 5\' 4"  (1.626 m)    GENERAL: The patient is a well-nourished female, in no acute distress. The vital signs are documented above. Typical bruising over her phlebectomy sites with no evidence of wound issues  MEDICAL ISSUES: Duplex shows closure of her anterior accessory great saphenous vein and no evidence of DVT.  Continue compression use for 1 additional week and then as needed.  Will see us again on an as-needed basis   Larina Earthlyodd F. Kaysin Brock, MD [facility] Office Tel [phone] Pager [phone]

## 2018-01-17 ENCOUNTER — Ambulatory Visit: Payer: Medicaid Other | Admitting: Adult Health

## 2018-01-24 ENCOUNTER — Ambulatory Visit: Payer: Medicaid Other | Admitting: Adult Health

## 2018-01-24 ENCOUNTER — Encounter: Payer: Self-pay | Admitting: Adult Health

## 2018-01-24 VITALS — BP 110/66 | HR 63 | Ht 64.0 in | Wt 201.0 lb

## 2018-01-24 DIAGNOSIS — R112 Nausea with vomiting, unspecified: Secondary | ICD-10-CM | POA: Diagnosis not present

## 2018-01-24 DIAGNOSIS — N926 Irregular menstruation, unspecified: Secondary | ICD-10-CM

## 2018-01-24 DIAGNOSIS — Z3201 Encounter for pregnancy test, result positive: Secondary | ICD-10-CM | POA: Diagnosis not present

## 2018-01-24 DIAGNOSIS — Z3A08 8 weeks gestation of pregnancy: Secondary | ICD-10-CM | POA: Insufficient documentation

## 2018-01-24 DIAGNOSIS — O219 Vomiting of pregnancy, unspecified: Secondary | ICD-10-CM

## 2018-01-24 DIAGNOSIS — O3680X0 Pregnancy with inconclusive fetal viability, not applicable or unspecified: Secondary | ICD-10-CM

## 2018-01-24 LAB — POCT URINE PREGNANCY: PREG TEST UR: POSITIVE — AB

## 2018-01-24 MED ORDER — DOXYLAMINE-PYRIDOXINE ER 20-20 MG PO TBCR: 1.0000 | EXTENDED_RELEASE_TABLET | Freq: Two times a day (BID) | ORAL | 0 refills | Status: DC

## 2018-01-24 NOTE — Progress Notes (Signed)
  Subjective:     Patient ID: Sheila Jenkins, female   DOB: [birth year], 27 y.o.   MRN: [id number]  HPI Sheila Jenkins is a 27 year old white female,single, in for UPT, has missed a period and had +HPT.   Review of Systems +missed period with +HPT +nausea and vomiting,took zofran  Reviewed past medical,surgical, social and family history. Reviewed medications and allergies.     Objective:   Physical Exam BP 110/66 (BP Location: Left Arm, Patient Position: Sitting, Cuff Size: Large)   Pulse 63   Ht  (1.626 m)   Wt 201 lb (91.2 kg)   LMP 11/24/2017 (Exact Date)   BMI 34.50 kg/m UPT+, about 8+5 weeks by LMP with EDD 08/31/18.Skin warm and dry. Neck: mid line trachea, normal thyroid, good ROM, no lymphadenopathy noted. Lungs: clear to ausculation bilaterally. Cardiovascular: regular rate and rhythm.Abdomen is soft and non tender.    Assessment:     1. Pregnancy test positive   2. [redacted] weeks gestation of pregnancy   3. Encounter to determine fetal viability of pregnancy, single or unspecified fetus   4. Nausea and vomiting during pregnancy prior to [redacted] weeks gestation       Plan:     Meds ordered this encounter  Medications  . Doxylamine-Pyridoxine ER (BONJESTA) 20-20 MG TBCR    Sig: Take 1 tablet by mouth 2 (two) times daily at 8 am and 10 pm.    Dispense:  30 tablet    Refill:  0    Order Specific Question:   Supervising Provider    Answer:   Lazaro Arms [[id number]]  Return in 1 week for dating [place] Review handout on First trimester and by [facility]

## 2018-01-24 NOTE — Patient Instructions (Signed)
First Trimester of Pregnancy The first trimester of pregnancy is from week 1 until the end of week 13 (months 1 through 3). A week after a sperm fertilizes an egg, the egg will implant on the wall of the uterus. This embryo will begin to develop into a baby. Genes from you and your partner will form the baby. The female genes will determine whether the baby will be a boy or a girl. At 6-8 weeks, the eyes and face will be formed, and the heartbeat can be seen on ultrasound. At the end of 12 weeks, all the baby's organs will be formed. Now that you are pregnant, you will want to do everything you can to have a healthy baby. Two of the most important things are to get good prenatal care and to follow your health care provider's instructions. Prenatal care is all the medical care you receive before the baby's birth. This care will help prevent, find, and treat any problems during the pregnancy and childbirth. Body changes during your first trimester Your body goes through many changes during pregnancy. The changes vary from woman to woman.  You may gain or lose a couple of pounds at first.  You may feel sick to your stomach (nauseous) and you may throw up (vomit). If the vomiting is uncontrollable, call your health care provider.  You may tire easily.  You may develop headaches that can be relieved by medicines. All medicines should be approved by your health care provider.  You may urinate more often. Painful urination may mean you have a bladder infection.  You may develop heartburn as a result of your pregnancy.  You may develop constipation because certain hormones are causing the muscles that push stool through your intestines to slow down.  You may develop hemorrhoids or swollen veins (varicose veins).  Your breasts may begin to grow larger and become tender. Your nipples may stick out more, and the tissue that surrounds them (areola) may become darker.  Your gums may bleed and may be  sensitive to brushing and flossing.  Dark spots or blotches (chloasma, mask of pregnancy) may develop on your face. This will likely fade after the baby is born.  Your menstrual periods will stop.  You may have a loss of appetite.  You may develop cravings for certain kinds of food.  You may have changes in your emotions from day to day, such as being excited to be pregnant or being concerned that something may go wrong with the pregnancy and baby.  You may have more vivid and strange dreams.  You may have changes in your hair. These can include thickening of your hair, rapid growth, and changes in texture. Some women also have hair loss during or after pregnancy, or hair that feels dry or thin. Your hair will most likely return to normal after your baby is born.  What to expect at prenatal visits During a routine prenatal visit:  You will be weighed to make sure you and the baby are growing normally.  Your blood pressure will be taken.  Your abdomen will be measured to track your baby's growth.  The fetal heartbeat will be listened to between weeks 10 and 14 of your pregnancy.  Test results from any previous visits will be discussed.  Your health care provider may ask you:  How you are feeling.  If you are feeling the baby move.  If you have had any abnormal symptoms, such as leaking fluid, bleeding, severe headaches,   or abdominal cramping.  If you are using any tobacco products, including cigarettes, chewing tobacco, and electronic cigarettes.  If you have any questions.  Other tests that may be performed during your first trimester include:  Blood tests to find your blood type and to check for the presence of any previous infections. The tests will also be used to check for low iron levels (anemia) and protein on red blood cells (Rh antibodies). Depending on your risk factors, or if you previously had diabetes during pregnancy, you may have tests to check for high blood  sugar that affects pregnant women (gestational diabetes).  Urine tests to check for infections, diabetes, or protein in the urine.  An ultrasound to confirm the proper growth and development of the baby.  Fetal screens for spinal cord problems (spina bifida) and Down syndrome.  HIV (human immunodeficiency virus) testing. Routine prenatal testing includes screening for HIV, unless you choose not to have this test.  You may need other tests to make sure you and the baby are doing well.  Follow these instructions at home: Medicines  Follow your health care provider's instructions regarding medicine use. Specific medicines may be either safe or unsafe to take during pregnancy.  Take a prenatal vitamin that contains at least 600 micrograms (mcg) of folic acid.  If you develop constipation, try taking a stool softener if your health care provider approves. Eating and drinking  Eat a balanced diet that includes fresh fruits and vegetables, whole grains, good sources of protein such as meat, eggs, or tofu, and low-fat dairy. Your health care provider will help you determine the amount of weight gain that is right for you.  Avoid raw meat and uncooked cheese. These carry germs that can cause birth defects in the baby.  Eating four or five small meals rather than three large meals a day may help relieve nausea and vomiting. If you start to feel nauseous, eating a few soda crackers can be helpful. Drinking liquids between meals, instead of during meals, also seems to help ease nausea and vomiting.  Limit foods that are high in fat and processed sugars, such as fried and sweet foods.  To prevent constipation: ? Eat foods that are high in fiber, such as fresh fruits and vegetables, whole grains, and beans. ? Drink enough fluid to keep your urine clear or pale yellow. Activity  Exercise only as directed by your health care provider. Most women can continue their usual exercise routine during  pregnancy. Try to exercise for 30 minutes at least 5 days a week. Exercising will help you: ? Control your weight. ? Stay in shape. ? Be prepared for labor and delivery.  Experiencing pain or cramping in the lower abdomen or lower back is a good sign that you should stop exercising. Check with your health care provider before continuing with normal exercises.  Try to avoid standing for long periods of time. Move your legs often if you must stand in one place for a long time.  Avoid heavy lifting.  Wear low-heeled shoes and practice good posture.  You may continue to have sex unless your health care provider tells you not to. Relieving pain and discomfort  Wear a good support bra to relieve breast tenderness.  Take warm sitz baths to soothe any pain or discomfort caused by hemorrhoids. Use hemorrhoid cream if your health care provider approves.  Rest with your legs elevated if you have leg cramps or low back pain.  If you develop   varicose veins in your legs, wear support hose. Elevate your feet for 15 minutes, 3-4 times a day. Limit salt in your diet. Prenatal care  Schedule your prenatal visits by the twelfth week of pregnancy. They are usually scheduled monthly at first, then more often in the last 2 months before delivery.  Write down your questions. Take them to your prenatal visits.  Keep all your prenatal visits as told by your health care provider. This is important. Safety  Wear your seat belt at all times when driving.  Make a list of emergency phone numbers, including numbers for family, friends, the hospital, and police and fire departments. General instructions  Ask your health care provider for a referral to a local prenatal education class. Begin classes no later than the beginning of month 6 of your pregnancy.  Ask for help if you have counseling or nutritional needs during pregnancy. Your health care provider can offer advice or refer you to specialists for help  with various needs.  Do not use hot tubs, steam rooms, or saunas.  Do not douche or use tampons or scented sanitary pads.  Do not cross your legs for long periods of time.  Avoid cat litter boxes and soil used by cats. These carry germs that can cause birth defects in the baby and possibly loss of the fetus by miscarriage or stillbirth.  Avoid all smoking, herbs, alcohol, and medicines not prescribed by your health care provider. Chemicals in these products affect the formation and growth of the baby.  Do not use any products that contain nicotine or tobacco, such as cigarettes and e-cigarettes. If you need help quitting, ask your health care provider. You may receive counseling support and other resources to help you quit.  Schedule a dentist appointment. At home, brush your teeth with a soft toothbrush and be gentle when you floss. Contact a health care provider if:  You have dizziness.  You have mild pelvic cramps, pelvic pressure, or nagging pain in the abdominal area.  You have persistent nausea, vomiting, or diarrhea.  You have a bad smelling vaginal discharge.  You have pain when you urinate.  You notice increased swelling in your face, hands, legs, or ankles.  You are exposed to fifth disease or chickenpox.  You are exposed to German measles (rubella) and have never had it. Get help right away if:  You have a fever.  You are leaking fluid from your vagina.  You have spotting or bleeding from your vagina.  You have severe abdominal cramping or pain.  You have rapid weight gain or loss.  You vomit blood or material that looks like coffee grounds.  You develop a severe headache.  You have shortness of breath.  You have any kind of trauma, such as from a fall or a car accident. Summary  The first trimester of pregnancy is from week 1 until the end of week 13 (months 1 through 3).  Your body goes through many changes during pregnancy. The changes vary from  woman to woman.  You will have routine prenatal visits. During those visits, your health care provider will examine you, discuss any test results you may have, and talk with you about how you are feeling. This information is not intended to replace advice given to you by your health care provider. Make sure you discuss any questions you have with your health care provider. Document Released: 09/06/2001 Document Revised: 08/24/2016 Document Reviewed: 08/24/2016 Elsevier Interactive Patient Education  2018 [employer]   Inc.  

## 2018-01-31 ENCOUNTER — Ambulatory Visit (INDEPENDENT_AMBULATORY_CARE_PROVIDER_SITE_OTHER): Payer: Medicaid Other

## 2018-01-31 DIAGNOSIS — O208 Other hemorrhage in early pregnancy: Secondary | ICD-10-CM | POA: Diagnosis not present

## 2018-01-31 DIAGNOSIS — O3680X0 Pregnancy with inconclusive fetal viability, not applicable or unspecified: Secondary | ICD-10-CM

## 2018-01-31 DIAGNOSIS — Z3A01 Less than 8 weeks gestation of pregnancy: Secondary | ICD-10-CM | POA: Diagnosis not present

## 2018-01-31 NOTE — Progress Notes (Signed)
[place] 7+2 wks,single IUP w/ YS,positive fht 157 bpm,normal ovaries bilat,crl 11.52 mm,subchorionic hemorrhage 3.6 x 1.6 x 1.4 cm,EDD 09/17/2018

## 2018-02-02 ENCOUNTER — Emergency Department (HOSPITAL_COMMUNITY)
Admission: EM | Admit: 2018-02-02 | Discharge: 2018-02-02 | Disposition: A | Discharge status: Home / Self Care | Payer: Medicaid Other | Attending: Emergency Medicine | Admitting: Emergency Medicine

## 2018-02-02 ENCOUNTER — Other Ambulatory Visit: Payer: Self-pay

## 2018-02-02 ENCOUNTER — Encounter (HOSPITAL_COMMUNITY): Payer: Self-pay | Admitting: *Deleted

## 2018-02-02 DIAGNOSIS — R1110 Vomiting, unspecified: Secondary | ICD-10-CM | POA: Insufficient documentation

## 2018-02-02 DIAGNOSIS — O219 Vomiting of pregnancy, unspecified: Secondary | ICD-10-CM

## 2018-02-02 DIAGNOSIS — F1590 Other stimulant use, unspecified, uncomplicated: Secondary | ICD-10-CM | POA: Insufficient documentation

## 2018-02-02 DIAGNOSIS — O99321 Drug use complicating pregnancy, first trimester: Secondary | ICD-10-CM | POA: Diagnosis not present

## 2018-02-02 DIAGNOSIS — Z79899 Other long term (current) drug therapy: Secondary | ICD-10-CM | POA: Insufficient documentation

## 2018-02-02 DIAGNOSIS — Z3A08 8 weeks gestation of pregnancy: Secondary | ICD-10-CM | POA: Diagnosis not present

## 2018-02-02 LAB — CBC
HEMATOCRIT: 38.5 % (ref 36.0–46.0)
HEMOGLOBIN: 12.6 g/dL (ref 12.0–15.0)
MCH: 29.6 pg (ref 26.0–34.0)
MCHC: 32.7 g/dL (ref 30.0–36.0)
MCV: 90.4 fL (ref 78.0–100.0)
Platelets: 331 10*3/uL (ref 150–400)
RBC: 4.26 MIL/uL (ref 3.87–5.11)
RDW: 12.8 % (ref 11.5–15.5)
WBC: 9.3 10*3/uL (ref 4.0–10.5)

## 2018-02-02 LAB — BASIC METABOLIC PANEL
ANION GAP: 8 (ref 5–15)
BUN: 8 mg/dL (ref 6–20)
CALCIUM: 9 mg/dL (ref 8.9–10.3)
CHLORIDE: 103 mmol/L (ref 101–111)
CO2: 23 mmol/L (ref 22–32)
Creatinine, Ser: 0.6 mg/dL (ref 0.44–1.00)
GFR calc non Af Amer: >60 mL/min (ref >60)
Glucose, Bld: 90 mg/dL (ref 65–99)
POTASSIUM: 3.7 mmol/L (ref 3.5–5.1)
Sodium: 134 mmol/L — ABNORMAL LOW (ref 135–145)

## 2018-02-02 MED ORDER — ACETAMINOPHEN 325 MG PO TABS
650.0000 mg | ORAL_TABLET | Freq: Once | ORAL | Status: AC
  Administered 2018-02-02: 650 mg via ORAL
  Filled 2018-02-02: qty 2

## 2018-02-02 MED ORDER — METOCLOPRAMIDE HCL 10 MG PO TABS: 10.0000 mg | ORAL_TABLET | Freq: Three times a day (TID) | ORAL | 0 refills | Status: DC | PRN

## 2018-02-02 MED ORDER — SODIUM CHLORIDE 0.9 % IV BOLUS
1000.0000 mL | Freq: Once | INTRAVENOUS | Status: AC
  Administered 2018-02-02: 1000 mL via INTRAVENOUS

## 2018-02-02 MED ORDER — METOCLOPRAMIDE HCL 5 MG/ML IJ SOLN
10.0000 mg | Freq: Once | INTRAMUSCULAR | Status: AC
  Administered 2018-02-02: 10 mg via INTRAVENOUS
  Filled 2018-02-02: qty 2

## 2018-02-02 NOTE — ED Notes (Signed)
Patient ambulated to bathroom with no assistance or difficulty. Drank a cup of water and ice with no problems. Patient has had no episodes of vomiting while in ER.

## 2018-02-02 NOTE — Discharge Instructions (Addendum)
Drink plenty of fluids, take the medication as needed for nausea, follow up with your [facility] GYN doctor as planned

## 2018-02-02 NOTE — ED Triage Notes (Signed)
Pt c/o headache, vomiting, feeling shaky worsening over the past 2 days. Pt reports she has had these symptoms for 2 weeks. Pt is [redacted] weeks pregnant. Pt sees OB at [facility]. Last appt was 01/24/18 and was everything was good with the pregnancy so far.

## 2018-02-02 NOTE — ED Provider Notes (Signed)
[facility] EMERGENCY DEPARTMENT Provider Note   CSN: [id number] Arrival date & time: 02/02/18  1159     History   Chief Complaint Chief Complaint  Patient presents with  . Emesis During Pregnancy    HPI Sheila Jenkins is a 27 y.o. female.  Patient is [redacted] weeks pregnant.  This is her fourth pregnancy.  Patient has been going to her prenatal care appointments and was seen at [facility] on the first and she was told everything seemed to be going well.  She had an ultrasound that showed an intrauterine pregnancy by report.  Patient states that last several days she has had trouble with nausea vomiting and feeling shaky.  She also has a generalized headache.  She has not been able to eat well.  Today she has not vomited but she has not had anything to eat because whenever she tries to eat she feels nauseated.  Patient denies any fevers or chills.  No recent injuries.  No abdominal pain.  No vaginal bleeding.  The history is provided by the patient.  Emesis   This is a new problem. The current episode started more than 2 days ago. The problem has not changed since onset.The emesis has an appearance of stomach contents. There has been no fever. Associated symptoms include headaches. Pertinent negatives include no abdominal pain, no arthralgias, no chills, no cough, no diarrhea, no fever, no myalgias, no sweats and no URI.    Past Medical History:  Diagnosis Date  . Varicose veins     Patient Active Problem List   Diagnosis Date Noted  . Encounter to determine fetal viability of pregnancy 01/24/2018  . [redacted] weeks gestation of pregnancy 01/24/2018  . Pregnancy test positive 01/24/2018  . Nausea and vomiting during pregnancy prior to [redacted] weeks gestation 01/24/2018  . History of chlamydia 07/19/2017  . History of miscarriage 07/19/2017  . Abdominal cramps 07/19/2017  . Menorrhagia with irregular cycle 07/19/2017  . 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 Chronic venous insufficiency 12/18/2015    Past Surgical History:  Procedure Laterality Date  . VARICOSE VEIN SURGERY  11/2017  . WISDOM TOOTH EXTRACTION       OB History    Gravida  4   Para  2   Term  2   Preterm      AB  1   Living  2     SAB  1   TAB      Ectopic      Multiple      Live Births  2            Home Medications    Prior to Admission medications   Medication Sig Start Date End Date Taking? Authorizing Provider  Doxylamine-Pyridoxine ER (BONJESTA) 20-20 MG TBCR Take 1 tablet by mouth 2 (two) times daily at 8 am and 10 pm. 01/24/18  Yes Adline Potter, NP  prenatal vitamin w/FE, FA (PRENATAL 1 + 1) 27-1 MG TABS tablet Take 1 tablet by mouth daily at 12 noon. 08/31/17  Yes Adline Potter, NP  metoCLOPramide (REGLAN) 10 MG tablet Take 1 tablet (10 mg total) by mouth every 8 (eight) hours as needed for nausea or vomiting. 02/02/18   Linwood Dibbles, MD    Family History Family History  Problem Relation Age of Onset  . Diabetes Maternal Grandmother   . Diabetes Maternal Grandfather     Social History Social History  Tobacco Use  . Smoking status: Never Smoker  . Smokeless tobacco: Never Used  Substance Use Topics  . Alcohol use: No    Alcohol/week: 0.0 oz  . Drug use: No     Allergies   Patient has no known allergies.   Review of Systems Review of Systems  Constitutional: Negative for chills and fever.  Respiratory: Negative for cough.   Gastrointestinal: Positive for vomiting. Negative for abdominal pain and diarrhea.  Musculoskeletal: Negative for arthralgias and myalgias.  Neurological: Positive for headaches.  All other systems reviewed and are negative.    Physical Exam Updated Vital Signs BP 131/80 (BP Location: Right Arm)   Pulse 69   Temp 98 F (36.7 C) (Oral)   Resp 16   Ht 1.626 m ( )   Wt 90.7 kg (200 lb)   LMP 11/24/2017 (Exact Date)   SpO2 100%   BMI 34.33 kg/m   Physical Exam   Constitutional: She appears well-developed and well-nourished. No distress.  HENT:  Head: Normocephalic and atraumatic.  Right Ear: External ear normal.  Left Ear: External ear normal.  Eyes: Conjunctivae are normal. Right eye exhibits no discharge. Left eye exhibits no discharge. No scleral icterus.  Neck: Neck supple. No tracheal deviation present.  Cardiovascular: Normal rate, regular rhythm and intact distal pulses.  Pulmonary/Chest: Effort normal and breath sounds normal. No stridor. No respiratory distress. She has no wheezes. She has no rales.  Abdominal: Soft. Bowel sounds are normal. She exhibits no distension. There is no tenderness. There is no rebound and no guarding.  Musculoskeletal: She exhibits no edema or tenderness.  Neurological: She is alert. She has normal strength. No cranial nerve deficit (no facial droop, extraocular movements intact, no slurred speech) or sensory deficit. She exhibits normal muscle tone. She displays no seizure activity. Coordination normal.  Skin: Skin is warm and dry. No rash noted.  Psychiatric: She has a normal mood and affect.  Nursing note and vitals reviewed.    ED Treatments / Results  Labs (all labs ordered are listed, but only abnormal results are displayed) Labs Reviewed  BASIC METABOLIC PANEL - Abnormal; Notable for the following components:      Result Value   Sodium 134 (*)    All other components within normal limits  CBC     Procedures Procedures (including critical care time)  Medications Ordered in ED Medications  sodium chloride 0.9 % bolus 1,000 mL (0 mLs Intravenous Stopped 02/02/18 1507)  metoCLOPramide (REGLAN) injection 10 mg (10 mg Intravenous Given 02/02/18 1300)  acetaminophen (TYLENOL) tablet 650 mg (650 mg Oral Given 02/02/18 1300)     Initial Impression / Assessment and Plan / ED Course  I have reviewed the triage vital signs and the nursing notes.  Pertinent labs & imaging results that were available  during my care of the patient were reviewed by me and considered in my medical decision making (see chart for details).    Patient presented to the emergency room for evaluation of nausea associated with her pregnancy.  Patient was treated with IV fluids and antinausea medications.  Laboratory tests were reviewed and there are no significant abnormalities.  Patient is feeling much better after treatment.  She is able to drink without any nausea or vomiting.  Patient also had a headache that improved with Tylenol.  I suspect this may partly have been related to her inability to eat and dehydration.  No signs of any serious complications associated with her pregnancy.  No signs of any acute infections.  Patient feels much better and is ready for discharge.  Final Clinical Impressions(s) / ED Diagnoses   Final diagnoses:  Nausea/vomiting in pregnancy    ED Discharge Orders        Ordered    metoCLOPramide (REGLAN) 10 MG tablet  Every 8 hours PRN     02/02/18 1531       Linwood Dibbles, MD 02/02/18 1540

## 2018-02-14 ENCOUNTER — Ambulatory Visit (INDEPENDENT_AMBULATORY_CARE_PROVIDER_SITE_OTHER): Payer: Medicaid Other | Admitting: Advanced Practice Midwife

## 2018-02-14 ENCOUNTER — Ambulatory Visit: Payer: Medicaid Other | Admitting: *Deleted

## 2018-02-14 ENCOUNTER — Encounter: Payer: Self-pay | Admitting: Advanced Practice Midwife

## 2018-02-14 VITALS — BP 126/84 | HR 70 | Wt 200.0 lb

## 2018-02-14 DIAGNOSIS — Z3682 Encounter for antenatal screening for nuchal translucency: Secondary | ICD-10-CM

## 2018-02-14 DIAGNOSIS — Z8759 Personal history of other complications of pregnancy, childbirth and the puerperium: Secondary | ICD-10-CM

## 2018-02-14 DIAGNOSIS — Z1389 Encounter for screening for other disorder: Secondary | ICD-10-CM

## 2018-02-14 DIAGNOSIS — O09291 Supervision of pregnancy with other poor reproductive or obstetric history, first trimester: Secondary | ICD-10-CM | POA: Diagnosis not present

## 2018-02-14 DIAGNOSIS — Z331 Pregnant state, incidental: Secondary | ICD-10-CM

## 2018-02-14 DIAGNOSIS — O9989 Other specified diseases and conditions complicating pregnancy, childbirth and the puerperium: Secondary | ICD-10-CM | POA: Diagnosis not present

## 2018-02-14 DIAGNOSIS — O26811 Pregnancy related exhaustion and fatigue, first trimester: Secondary | ICD-10-CM | POA: Diagnosis not present

## 2018-02-14 DIAGNOSIS — R110 Nausea: Secondary | ICD-10-CM | POA: Diagnosis not present

## 2018-02-14 DIAGNOSIS — Z3490 Encounter for supervision of normal pregnancy, unspecified, unspecified trimester: Secondary | ICD-10-CM | POA: Insufficient documentation

## 2018-02-14 DIAGNOSIS — Z3A09 9 weeks gestation of pregnancy: Secondary | ICD-10-CM

## 2018-02-14 DIAGNOSIS — Z3481 Encounter for supervision of other normal pregnancy, first trimester: Secondary | ICD-10-CM

## 2018-02-14 LAB — POCT URINALYSIS DIPSTICK
GLUCOSE UA: NEGATIVE
KETONES UA: NEGATIVE
Leukocytes, UA: NEGATIVE
Nitrite, UA: NEGATIVE
Protein, UA: POSITIVE — AB
RBC UA: NEGATIVE

## 2018-02-14 MED ORDER — PROMETHAZINE HCL 25 MG PO TABS: 25.0000 mg | ORAL_TABLET | Freq: Four times a day (QID) | ORAL | 1 refills | Status: DC | PRN

## 2018-02-14 NOTE — Progress Notes (Signed)
  Subjective:    Sheila Jenkins is a [id number] [redacted]w[redacted]d being seen today for her first obstetrical visit.  Her obstetrical history is significant for 2 term SVDs, IOL for GHTN.  Pregnancy history fully reviewed.  Patient reports fatigue and nausea.  Vitals:   02/14/18 1023  BP: 130/90  Pulse: 70  Weight: 200 lb (90.7 kg)    HISTORY: OB History  Gravida Para Term Preterm AB Living  SAB TAB Ectopic Multiple Live Births  1       2    # Outcome Date GA Lbr Len/2nd Weight Sex Delivery Anes PTL Lv  4 Current           3 SAB 02/20/17          2 Term 09/20/12 [redacted]w[redacted]d  8 lb (3.629 kg) M Vag-Spont EPI N LIV     Complications: Pregnancy induced hypertension  1 Term 03/02/11 [redacted]w[redacted]d  7 lb (3.175 kg) F Vag-Spont EPI N LIV     Complications: Gestational hypertension   Past Medical History:  Diagnosis Date  . Varicose veins    Past Surgical History:  Procedure Laterality Date  . VARICOSE VEIN SURGERY  11/2017  . WISDOM TOOTH EXTRACTION     Family History  Problem Relation Age of Onset  . Diabetes Maternal Grandmother   . Diabetes Maternal Grandfather   . Drug abuse Father   . Drug abuse Mother   . Asthma Brother   . Suicidality Brother   . Asthma Son   . Allergies Son   . Allergies Daughter      Exam                                      System:     Skin: normal coloration and turgor, no rashes    Neurologic: oriented, normal, normal mood   Extremities: normal strength, tone, and muscle mass   HEENT PERRLA   Mouth/Teeth mucous membranes moist, nomrla dentition   Neck supple and no masses   Cardiovascular: regular rate and rhythm   Respiratory:  appears well, vitals normal, no respiratory distress, acyanotic   Abdomen: soft, non-tender;  FHR: 160 [place]        The nature of [facility] with multiple MDs and other Advanced Practice Providers was explained to patient; also emphasized that residents, students are part of  our team.  Assessment:    Pregnancy: [id number] Patient Active Problem List   Diagnosis Date Noted  . Supervision of normal pregnancy 02/14/2018  . History of gestational hypertension 02/14/2018  . Nausea and vomiting during pregnancy prior to [redacted] weeks gestation 01/24/2018  . History of chlamydia 07/19/2017  . History of miscarriage 07/19/2017  . Abdominal cramps 07/19/2017  . 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 Chronic venous insufficiency 12/18/2015        Plan:     Initial labs drawn. Continue prenatal vitamins  Problem list reviewed and updated  Rx phenergan. Bonjesta and reglan not really helpful Reviewed n/v relief measures and warning s/s to report  Reviewed recommended weight gain based on pre-gravid BMI  Encouraged well-balanced diet Genetic Screening discussed Integrated Screen: requested.  Ultrasound discussed; fetal survey: requested.  Return in about 1 month (around 03/14/2018) for LROB, US:NT+1st IT.  Jacklyn Shell 02/14/2018

## 2018-02-15 LAB — OBSTETRIC PANEL, INCLUDING HIV
ANTIBODY SCREEN: NEGATIVE
BASOS: 0 %
Basophils Absolute: 0 10*3/uL (ref 0.0–0.2)
EOS (ABSOLUTE): 0 10*3/uL (ref 0.0–0.4)
Eos: 1 %
HEMATOCRIT: 39.5 % (ref 34.0–46.6)
HIV Screen 4th Generation wRfx: NONREACTIVE
Hemoglobin: 12.8 g/dL (ref 11.1–15.9)
Hepatitis B Surface Ag: NEGATIVE
IMMATURE GRANS (ABS): 0 10*3/uL (ref 0.0–0.1)
Immature Granulocytes: 0 %
LYMPHS: 19 %
Lymphocytes Absolute: 1.6 10*3/uL (ref 0.7–3.1)
MCH: 29.8 pg (ref 26.6–33.0)
MCHC: 32.4 g/dL (ref 31.5–35.7)
MCV: 92 fL (ref 79–97)
MONOS ABS: 0.8 10*3/uL (ref 0.1–0.9)
Monocytes: 9 %
Neutrophils Absolute: 6.2 10*3/uL (ref 1.4–7.0)
Neutrophils: 71 %
Platelets: 335 10*3/uL (ref 150–450)
RBC: 4.3 x10E6/uL (ref 3.77–5.28)
RDW: 13.4 % (ref 12.3–15.4)
RH TYPE: POSITIVE
RPR: NONREACTIVE
Rubella Antibodies, IGG: 1.99 index (ref >0.99)
WBC: 8.6 10*3/uL (ref 3.4–10.8)

## 2018-02-15 LAB — URINALYSIS, ROUTINE W REFLEX MICROSCOPIC
Bilirubin, UA: NEGATIVE
GLUCOSE, UA: NEGATIVE
Ketones, UA: NEGATIVE
Leukocytes, UA: NEGATIVE
NITRITE UA: NEGATIVE
PH UA: 8 — AB (ref 5.0–7.5)
Protein, UA: NEGATIVE
RBC, UA: NEGATIVE
Specific Gravity, UA: 1.029 (ref 1.005–1.030)
Urobilinogen, Ur: 0.2 mg/dL (ref 0.2–1.0)

## 2018-02-15 LAB — PMP SCREEN PROFILE (10S), URINE
Amphetamine Scrn, Ur: NEGATIVE ng/mL
BARBITURATE SCREEN URINE: NEGATIVE ng/mL
BENZODIAZEPINE SCREEN, URINE: NEGATIVE ng/mL
CANNABINOIDS UR QL SCN: NEGATIVE ng/mL
CREATININE(CRT), U: 179.3 mg/dL (ref 20.0–300.0)
Cocaine (Metab) Scrn, Ur: NEGATIVE ng/mL
METHADONE SCREEN, URINE: NEGATIVE ng/mL
OXYCODONE+OXYMORPHONE UR QL SCN: NEGATIVE ng/mL
Opiate Scrn, Ur: NEGATIVE ng/mL
PH UR, DRUG SCRN: 8.5 (ref 4.5–8.9)
PROPOXYPHENE SCREEN URINE: NEGATIVE ng/mL
Phencyclidine Qn, Ur: NEGATIVE ng/mL

## 2018-02-16 LAB — URINE CULTURE

## 2018-02-16 LAB — GC/CHLAMYDIA PROBE AMP
Chlamydia trachomatis, NAA: NEGATIVE
NEISSERIA GONORRHOEAE BY PCR: NEGATIVE

## 2018-02-22 ENCOUNTER — Encounter (INDEPENDENT_AMBULATORY_CARE_PROVIDER_SITE_OTHER): Payer: Self-pay

## 2018-02-22 ENCOUNTER — Telehealth: Payer: Self-pay | Admitting: *Deleted

## 2018-02-22 ENCOUNTER — Other Ambulatory Visit: Payer: Self-pay | Admitting: Advanced Practice Midwife

## 2018-02-22 MED ORDER — PROMETHAZINE HCL 25 MG RE SUPP: 25.0000 mg | Freq: Four times a day (QID) | RECTAL | 0 refills | Status: DC | PRN

## 2018-03-14 ENCOUNTER — Ambulatory Visit (INDEPENDENT_AMBULATORY_CARE_PROVIDER_SITE_OTHER): Payer: Medicaid Other | Admitting: Advanced Practice Midwife

## 2018-03-14 ENCOUNTER — Ambulatory Visit (INDEPENDENT_AMBULATORY_CARE_PROVIDER_SITE_OTHER): Payer: Medicaid Other

## 2018-03-14 ENCOUNTER — Encounter: Payer: Self-pay | Admitting: Advanced Practice Midwife

## 2018-03-14 VITALS — BP 114/75 | HR 72 | Wt 194.0 lb

## 2018-03-14 DIAGNOSIS — Z3682 Encounter for antenatal screening for nuchal translucency: Secondary | ICD-10-CM

## 2018-03-14 DIAGNOSIS — Z1379 Encounter for other screening for genetic and chromosomal anomalies: Secondary | ICD-10-CM

## 2018-03-14 DIAGNOSIS — Z8759 Personal history of other complications of pregnancy, childbirth and the puerperium: Secondary | ICD-10-CM

## 2018-03-14 DIAGNOSIS — Z3481 Encounter for supervision of other normal pregnancy, first trimester: Secondary | ICD-10-CM

## 2018-03-14 DIAGNOSIS — R12 Heartburn: Secondary | ICD-10-CM

## 2018-03-14 DIAGNOSIS — O9989 Other specified diseases and conditions complicating pregnancy, childbirth and the puerperium: Secondary | ICD-10-CM

## 2018-03-14 DIAGNOSIS — Z3A13 13 weeks gestation of pregnancy: Secondary | ICD-10-CM | POA: Diagnosis not present

## 2018-03-14 DIAGNOSIS — Z1389 Encounter for screening for other disorder: Secondary | ICD-10-CM

## 2018-03-14 DIAGNOSIS — R110 Nausea: Secondary | ICD-10-CM

## 2018-03-14 DIAGNOSIS — R800 Isolated proteinuria: Secondary | ICD-10-CM

## 2018-03-14 DIAGNOSIS — Z331 Pregnant state, incidental: Secondary | ICD-10-CM

## 2018-03-14 DIAGNOSIS — Z3402 Encounter for supervision of normal first pregnancy, second trimester: Secondary | ICD-10-CM

## 2018-03-14 LAB — POCT URINALYSIS DIPSTICK
Blood, UA: NEGATIVE
GLUCOSE UA: NEGATIVE
Leukocytes, UA: NEGATIVE
NITRITE UA: NEGATIVE
Protein, UA: POSITIVE — AB

## 2018-03-14 MED ORDER — ONDANSETRON 4 MG PO TBDP: 4.0000 mg | ORAL_TABLET | Freq: Four times a day (QID) | ORAL | 2 refills | Status: DC | PRN

## 2018-03-14 NOTE — Progress Notes (Signed)
US 13+2 wks,measurements c/w dates,normal ovaries bilat,CRL 73.33 mm,fhr 167 bpm,anterior pl gr 0,NB present,NT 1.4 mm

## 2018-03-14 NOTE — Progress Notes (Signed)
  [id number] Estimated Date of Delivery: 09/17/18  Blood pressure 114/75, pulse 72, weight 194 lb (88 kg), last menstrual period 11/24/2017.   BP weight and urine results all reviewed and noted. Denies UTI sx Please refer to the obstetrical flow sheet for the fundal height and fetal heart rate documentation:  Patient denies any bleeding and no rupture of membranes symptoms or regular contractions. Patient still having a lot of nausea and heartburn All questions were answered.   Physical Assessment:   Vitals:   03/14/18 1128  BP: 114/75  Pulse: 72  Weight: 194 lb (88 kg)  Body mass index is 33.3 kg/m.        Physical Examination:   General appearance: Well appearing, and in no distress  Mental status: Alert, oriented to person, place, and time  Skin: Warm & dry  Cardiovascular: Normal heart rate noted  Respiratory: Normal respiratory effort, no distress  Abdomen: Soft, gravid, nontender  Pelvic: Cervical exam deferred         Extremities: Edema: None  Fetal Status: Fetal Heart Rate (bpm): 167       US 13+2 wks,measurements c/w dates,normal ovaries bilat,CRL 73.33 mm,fhr 167 bpm,anterior pl gr 0,NB present,NT 1.4 mm    Results for orders placed or performed in visit on 03/14/18 (from the past 24 hour(s))  POCT urinalysis dipstick   Collection Time: 03/14/18 11:35 AM  Result Value Ref Range   Color, UA     Clarity, UA     Glucose, UA Negative Negative   Bilirubin, UA     Ketones, UA large    Spec Grav, UA  1.010 - 1.025   Blood, UA neg    pH, UA  5.0 - 8.0   Protein, UA Positive (A) Negative   Urobilinogen, UA  0.2 or 1.0 E.U./dL   Nitrite, UA neg    Leukocytes, UA Negative Negative   Appearance     Odor       Orders Placed This Encounter  Procedures  . Urine Culture  . Integrated 1  . POCT urinalysis dipstick    Plan:  Continued routine obstetrical care,  Return in about 1 month (around 04/11/2018) for 2nd IT, LROB.

## 2018-03-14 NOTE — Patient Instructions (Signed)
Sheila Jenkins, I greatly value your feedback.  If you receive a survey following your visit with us today, we appreciate you taking the time to fill it out.  Thanks, Cathie BeamsFran Cresenzo-Dishmon, CNM     Second Trimester of Pregnancy The second trimester is from week 14 through week 27 (months 4 through 6). The second trimester is often a time when you feel your best. Your body has adjusted to being pregnant, and you begin to feel better physically. Usually, morning sickness has lessened or quit completely, you may have more energy, and you may have an increase in appetite. The second trimester is also a time when the fetus is growing rapidly. At the end of the sixth month, the fetus is about 9 inches long and weighs about 1 pounds. You will likely begin to feel the baby move (quickening) between 16 and 20 weeks of pregnancy. Body changes during your second trimester Your body continues to go through many changes during your second trimester. The changes vary from woman to woman.  Your weight will continue to increase. You will notice your lower abdomen bulging out.  You may begin to get stretch marks on your hips, abdomen, and breasts.  You may develop headaches that can be relieved by medicines. The medicines should be approved by your health care provider.  You may urinate more often because the fetus is pressing on your bladder.  You may develop or continue to have heartburn as a result of your pregnancy.  You may develop constipation because certain hormones are causing the muscles that push waste through your intestines to slow down.  You may develop hemorrhoids or swollen, bulging veins (varicose veins).  You may have back pain. This is caused by: ? Weight gain. ? Pregnancy hormones that are relaxing the joints in your pelvis. ? A shift in weight and the muscles that support your balance.  Your breasts will continue to grow and they will continue to become tender.  Your gums may  bleed and may be sensitive to brushing and flossing.  Dark spots or blotches (chloasma, mask of pregnancy) may develop on your face. This will likely fade after the baby is born.  A dark line from your belly button to the pubic area (linea nigra) may appear. This will likely fade after the baby is born.  You may have changes in your hair. These can include thickening of your hair, rapid growth, and changes in texture. Some women also have hair loss during or after pregnancy, or hair that feels dry or thin. Your hair will most likely return to normal after your baby is born.  What to expect at prenatal visits During a routine prenatal visit:  You will be weighed to make sure you and the fetus are growing normally.  Your blood pressure will be taken.  Your abdomen will be measured to track your baby's growth.  The fetal heartbeat will be listened to.  Any test results from the previous visit will be discussed.  Your health care provider may ask you:  How you are feeling.  If you are feeling the baby move.  If you have had any abnormal symptoms, such as leaking fluid, bleeding, severe headaches, or abdominal cramping.  If you are using any tobacco products, including cigarettes, chewing tobacco, and electronic cigarettes.  If you have any questions.  Other tests that may be performed during your second trimester include:  Blood tests that check for: ? Low iron levels (anemia). ? High  blood sugar that affects pregnant women (gestational diabetes) between 42 and 28 weeks. ? Rh antibodies. This is to check for a protein on red blood cells (Rh factor).  Urine tests to check for infections, diabetes, or protein in the urine.  An ultrasound to confirm the proper growth and development of the baby.  An amniocentesis to check for possible genetic problems.  Fetal screens for spina bifida and Down syndrome.  HIV (human immunodeficiency virus) testing. Routine prenatal testing  includes screening for HIV, unless you choose not to have this test.  Follow these instructions at home: Medicines  Follow your health care provider's instructions regarding medicine use. Specific medicines may be either safe or unsafe to take during pregnancy.  Take a prenatal vitamin that contains at least 600 micrograms (mcg) of folic acid.  If you develop constipation, try taking a stool softener if your health care provider approves. Eating and drinking  Eat a balanced diet that includes fresh fruits and vegetables, whole grains, good sources of protein such as meat, eggs, or tofu, and low-fat dairy. Your health care provider will help you determine the amount of weight gain that is right for you.  Avoid raw meat and uncooked cheese. These carry germs that can cause birth defects in the baby.  If you have low calcium intake from food, talk to your health care provider about whether you should take a daily calcium supplement.  Limit foods that are high in fat and processed sugars, such as fried and sweet foods.  To prevent constipation: ? Drink enough fluid to keep your urine clear or pale yellow. ? Eat foods that are high in fiber, such as fresh fruits and vegetables, whole grains, and beans. Activity  Exercise only as directed by your health care provider. Most women can continue their usual exercise routine during pregnancy. Try to exercise for 30 minutes at least 5 days a week. Stop exercising if you experience uterine contractions.  Avoid heavy lifting, wear low heel shoes, and practice good posture.  A sexual relationship may be continued unless your health care provider directs you otherwise. Relieving pain and discomfort  Wear a good support bra to prevent discomfort from breast tenderness.  Take warm sitz baths to soothe any pain or discomfort caused by hemorrhoids. Use hemorrhoid cream if your health care provider approves.  Rest with your legs elevated if you have  leg cramps or low back pain.  If you develop varicose veins, wear support hose. Elevate your feet for 15 minutes, 3-4 times a day. Limit salt in your diet. Prenatal Care  Write down your questions. Take them to your prenatal visits.  Keep all your prenatal visits as told by your health care provider. This is important. Safety  Wear your seat belt at all times when driving.  Make a list of emergency phone numbers, including numbers for family, friends, the hospital, and police and fire departments. General instructions  Ask your health care provider for a referral to a local prenatal education class. Begin classes no later than the beginning of month 6 of your pregnancy.  Ask for help if you have counseling or nutritional needs during pregnancy. Your health care provider can offer advice or refer you to specialists for help with various needs.  Do not use hot tubs, steam rooms, or saunas.  Do not douche or use tampons or scented sanitary pads.  Do not cross your legs for long periods of time.  Avoid cat litter boxes and soil  used by cats. These carry germs that can cause birth defects in the baby and possibly loss of the fetus by miscarriage or stillbirth.  Avoid all smoking, herbs, alcohol, and unprescribed drugs. Chemicals in these products can affect the formation and growth of the baby.  Do not use any products that contain nicotine or tobacco, such as cigarettes and e-cigarettes. If you need help quitting, ask your health care provider.  Visit your dentist if you have not gone yet during your pregnancy. Use a soft toothbrush to brush your teeth and be gentle when you floss. Contact a health care provider if:  You have dizziness.  You have mild pelvic cramps, pelvic pressure, or nagging pain in the abdominal area.  You have persistent nausea, vomiting, or diarrhea.  You have a bad smelling vaginal discharge.  You have pain when you urinate. Get help right away if:  You  have a fever.  You are leaking fluid from your vagina.  You have spotting or bleeding from your vagina.  You have severe abdominal cramping or pain.  You have rapid weight gain or weight loss.  You have shortness of breath with chest pain.  You notice sudden or extreme swelling of your face, hands, ankles, feet, or legs.  You have not felt your baby move in over an hour.  You have severe headaches that do not go away when you take medicine.  You have vision changes. Summary  The second trimester is from week 14 through week 27 (months 4 through 6). It is also a time when the fetus is growing rapidly.  Your body goes through many changes during pregnancy. The changes vary from woman to woman.  Avoid all smoking, herbs, alcohol, and unprescribed drugs. These chemicals affect the formation and growth your baby.  Do not use any tobacco products, such as cigarettes, chewing tobacco, and e-cigarettes. If you need help quitting, ask your health care provider.  Contact your health care provider if you have any questions. Keep all prenatal visits as told by your health care provider. This is important. This information is not intended to replace advice given to you by your health care provider. Make sure you discuss any questions you have with your health care provider.      CHILDBIRTH CLASSES [phone] is the phone number for Pregnancy Classes or hospital tours at [place] will be referred to  [email or web address] for more information on childbirth classes  At this site you may register for classes. You may sign up for a waiting list if classes are full. Please SIGN UP FOR THIS!.   When the waiting list becomes long, sometimes new classes can be added.

## 2018-03-16 LAB — INTEGRATED 1
Crown Rump Length: 73.3 mm
GEST. AGE ON COLLECTION DATE: 13.3 wk
Maternal Age at EDD: 25.7 yr
NUCHAL TRANSLUCENCY (NT): 1.4 mm
Number of Fetuses: 1
PAPP-A Value: 427.3 ng/mL
WEIGHT: 194 [lb_av]

## 2018-03-16 LAB — URINE CULTURE: ORGANISM ID, BACTERIA: NO GROWTH

## 2018-03-27 ENCOUNTER — Telehealth: Payer: Self-pay | Admitting: Obstetrics & Gynecology

## 2018-03-28 ENCOUNTER — Telehealth: Payer: Self-pay | Admitting: *Deleted

## 2018-03-28 NOTE — Telephone Encounter (Signed)
Patient called stating she felt "tightness" in her belly up near her rib cage and was concerned it was her uterus having a contraction.  Informed patient at 15 weeks her uterus is still in her pelvis and could not be that high.  Informed it could be a spasm or gas pain.  States she finally had a BM last night but it was very hard and like pebbles.  Advised patient to push fluids and try taking a stool softner daily.  Verbalized understanding.

## 2018-04-02 ENCOUNTER — Encounter: Payer: Self-pay | Admitting: Obstetrics and Gynecology

## 2018-04-02 ENCOUNTER — Telehealth: Payer: Self-pay | Admitting: Obstetrics & Gynecology

## 2018-04-02 ENCOUNTER — Ambulatory Visit (INDEPENDENT_AMBULATORY_CARE_PROVIDER_SITE_OTHER): Payer: Medicaid Other | Admitting: Obstetrics and Gynecology

## 2018-04-02 DIAGNOSIS — Z331 Pregnant state, incidental: Secondary | ICD-10-CM

## 2018-04-02 DIAGNOSIS — O9989 Other specified diseases and conditions complicating pregnancy, childbirth and the puerperium: Secondary | ICD-10-CM

## 2018-04-02 DIAGNOSIS — K5900 Constipation, unspecified: Secondary | ICD-10-CM

## 2018-04-02 DIAGNOSIS — Z3A16 16 weeks gestation of pregnancy: Secondary | ICD-10-CM

## 2018-04-02 DIAGNOSIS — Z1389 Encounter for screening for other disorder: Secondary | ICD-10-CM

## 2018-04-02 LAB — POCT URINALYSIS DIPSTICK
Blood, UA: NEGATIVE
GLUCOSE UA: NEGATIVE
Ketones, UA: NEGATIVE
NITRITE UA: NEGATIVE
Protein, UA: NEGATIVE

## 2018-04-02 MED ORDER — BISACODYL 10 MG RE SUPP: 10.0000 mg | RECTAL | 0 refills | Status: DC | PRN

## 2018-04-02 NOTE — Telephone Encounter (Signed)
Patient states she is having severe abdominal pain to the point she is in tears.  Was recently constipated and too Miralax which helped a little but she is still in pain.  Informed patient she can be worked in today with Dr Emelda FearFerguson. Verbalized understanding.

## 2018-04-02 NOTE — Progress Notes (Signed)
Patient ID: Sheila Jenkins, female   DOB: 1991-02-01, 27 y.o.   MRN: [id number]   LOW-RISK PREGNANCY VISIT Patient name: Sheila Jenkins MRN [id number]  Date of birth: 1991-02-01 Chief Complaint:   work in ob (severe abd pain that goes into back and under ribs; vomiting also)  History of Present Illness:   Sheila Jenkins is a 27 y.o. [phone] female at 9366w0d with an Estimated Date of Delivery: 09/17/18 being seen today for ongoing management of a low-risk pregnancy. She has been vomiting since this morning and has chills. Her pain started last week, when she went to the doctor last week she was told to get a laxative for constipation. She has not had a bowel movement since last Tuesday 03/27/18 with small "pebble like" poop. Pain is constant at this time. She says she feels relief when doctor put pressure on her side and denies any knowledge of having diabetes. She denies any spotting or bleeding. Today she reports sever abdominal pain that goes into back and under ribs.  . Vag. Bleeding: None.   . denies leaking of fluid. Review of Systems:   Pertinent items are noted in HPI Denies abnormal vaginal discharge w/ itching/odor/irritation, headaches, visual changes, shortness of breath, or problems with urination or bowel movements unless otherwise stated above. Pertinent History Reviewed:  Reviewed past medical,surgical, social, obstetrical and family history.  Reviewed problem list, medications and allergies. Physical Assessment:   Vitals:   04/02/18 1005  BP: 119/74  Pulse: 73  Weight: 195 lb 6.4 oz (88.6 kg)  Body mass index is 33.54 kg/m.        Physical Examination:   General appearance: In distress  Mental status: Alert, oriented to person, place, and time  Skin: Warm & dry  Cardiovascular: Normal heart rate noted  Respiratory: Normal respiratory effort, no distress  Abdomen: Lower abdomen uterus non tender. Abd soft, no guarding or rebound. No hyperactive bowel sounds.   Pelvic:  Cervical exam performed, stool is felt and observed to be hard        Extremities: Edema: Trace  Fetal Status: Fetal Heart Rate (bpm): 156        Results for orders placed or performed in visit on 04/02/18 (from the past 24 hour(s))  POCT urinalysis dipstick   Collection Time: 04/02/18 10:07 AM  Result Value Ref Range   Color, UA     Clarity, UA     Glucose, UA Negative Negative   Bilirubin, UA     Ketones, UA neg    Spec Grav, UA  1.010 - 1.025   Blood, UA neg    pH, UA  5.0 - 8.0   Protein, UA Negative Negative   Urobilinogen, UA  0.2 or 1.0 E.U./dL   Nitrite, UA neg    Leukocytes, UA Moderate (2+) (A) Negative   Appearance     Odor      Assessment & Plan:  1) Low-risk pregnancy [id number] at 9566w0d with an Estimated Date of Delivery: 09/17/18   2) Constipation , discomfort due to this   Meds: No orders of the defined types were placed in this encounter.  Labs/procedures today: None Plan:  1. Add dulcolax suppository             2 if no success, fleets enema              3 continue daily miralax.po.  Follow-up: Return in about 1 month (around 04/30/2018) for LROB.  Orders Placed This Encounter  Procedures  .  POCT urinalysis dipstick   By signing my name below, I, Arnette Norris, attest that this documentation has been prepared under the direction and in the presence of Tilda Burrow, MD. Electronically Signed: Arnette Norris Medical Scribe. 04/02/18. 10:24 AM.  I personally performed the services described in this documentation, which was SCRIBED in my presence. The recorded information has been reviewed and considered accurate. It has been edited as necessary during review. Tilda Burrow, MD

## 2018-04-04 ENCOUNTER — Encounter: Payer: Medicaid Other | Admitting: Obstetrics and Gynecology

## 2018-04-11 ENCOUNTER — Ambulatory Visit (INDEPENDENT_AMBULATORY_CARE_PROVIDER_SITE_OTHER): Payer: Medicaid Other | Admitting: Advanced Practice Midwife

## 2018-04-11 ENCOUNTER — Encounter: Payer: Self-pay | Admitting: Advanced Practice Midwife

## 2018-04-11 VITALS — BP 138/91 | HR 90 | Wt 195.0 lb

## 2018-04-11 DIAGNOSIS — R109 Unspecified abdominal pain: Secondary | ICD-10-CM

## 2018-04-11 DIAGNOSIS — O9989 Other specified diseases and conditions complicating pregnancy, childbirth and the puerperium: Secondary | ICD-10-CM

## 2018-04-11 DIAGNOSIS — O26899 Other specified pregnancy related conditions, unspecified trimester: Secondary | ICD-10-CM

## 2018-04-11 DIAGNOSIS — Z1389 Encounter for screening for other disorder: Secondary | ICD-10-CM

## 2018-04-11 DIAGNOSIS — Z3482 Encounter for supervision of other normal pregnancy, second trimester: Secondary | ICD-10-CM

## 2018-04-11 DIAGNOSIS — Z1379 Encounter for other screening for genetic and chromosomal anomalies: Secondary | ICD-10-CM

## 2018-04-11 DIAGNOSIS — Z331 Pregnant state, incidental: Secondary | ICD-10-CM

## 2018-04-11 DIAGNOSIS — Z363 Encounter for antenatal screening for malformations: Secondary | ICD-10-CM

## 2018-04-11 DIAGNOSIS — Z3A17 17 weeks gestation of pregnancy: Secondary | ICD-10-CM

## 2018-04-11 LAB — POCT URINALYSIS DIPSTICK OB
Glucose, UA: NEGATIVE — AB
Leukocytes, UA: NEGATIVE
Nitrite, UA: NEGATIVE
RBC UA: NEGATIVE

## 2018-04-11 MED ORDER — ONDANSETRON 4 MG PO TBDP: 4.0000 mg | ORAL_TABLET | Freq: Four times a day (QID) | ORAL | 2 refills | Status: DC | PRN

## 2018-04-11 NOTE — Progress Notes (Signed)
  [id number] 4339w2d Estimated Date of Delivery: 09/17/18  Blood pressure (!) 138/91, pulse 90, weight 195 lb (88.5 kg), last menstrual period 11/24/2017.   BP weight and urine results all reviewed and noted.  Please refer to the obstetrical flow sheet for the fundal height and fetal heart rate documentation:  Patient reports good fetal movement, denies any bleeding and no rupture of membranes symptoms or regular contractions. Patient has had a pain across diaphragm and into back, and also lower back that radiates all along upper back , lasting >1 hr each time.  7/3, 7/8/ 7/8, &/7/12  Came in during 7/8 episode, not sure what was going on, but pt was constipated.  However, she is no longer constipated and still has had this pain. Also now c/o nausea and vomiting, not related to meals.  All questions were answered.   Physical Assessment:   Vitals:   04/11/18 1357  BP: (!) 138/91  Pulse: 90  Weight: 195 lb (88.5 kg)  Body mass index is 33.47 kg/m.        Physical Examination:   General appearance: Well appearing, and in no distress  Mental status: Alert, oriented to person, place, and time  Skin: Warm & dry  Cardiovascular: Normal heart rate noted  Respiratory: Normal respiratory effort, no distress  Abdomen: Soft, gravid, nontender  Pelvic: Cervical exam deferred         Extremities: Edema: Trace  Fetal Status: Fetal Heart Rate (bpm): 160        Results for orders placed or performed in visit on 04/11/18 (from the past 24 hour(s))  POC Urinalysis Dipstick OB   Collection Time: 04/11/18  1:58 PM  Result Value Ref Range   Color, UA     Clarity, UA     Glucose, UA Negative (A) (none)   Bilirubin, UA     Ketones, UA trace    Spec Grav, UA  1.010 - 1.025   Blood, UA neg    pH, UA  5.0 - 8.0   POC Protein UA Trace Negative, Trace   Urobilinogen, UA  0.2 or 1.0 E.U./dL   Nitrite, UA neg    Leukocytes, UA Negative Negative   Appearance     Odor       Orders Placed This  Encounter  Procedures  . US Abdomen Limited RUQ  . US OB Comp + 14 Wk  . INTEGRATED 2  . POC Urinalysis Dipstick OB    Plan:  Continued routine obstetrical care, ? Pain ? Gallbladder Will check US  2nd iT TODAY  Return today (on 04/11/2018) for JY:NWGNFAOS:Anatomy (any time next week) only; 4 weeks for LROB.

## 2018-04-11 NOTE — Patient Instructions (Signed)

## 2018-04-13 ENCOUNTER — Telehealth: Payer: Self-pay | Admitting: *Deleted

## 2018-04-13 ENCOUNTER — Ambulatory Visit (HOSPITAL_COMMUNITY)
Admission: RE | Admit: 2018-04-13 | Discharge: 2018-04-13 | Disposition: A | Discharge status: Home / Self Care | Payer: Medicaid Other | Source: Ambulatory Visit | Attending: Advanced Practice Midwife | Admitting: Advanced Practice Midwife

## 2018-04-13 DIAGNOSIS — Z3A00 Weeks of gestation of pregnancy not specified: Secondary | ICD-10-CM | POA: Diagnosis not present

## 2018-04-13 DIAGNOSIS — K8020 Calculus of gallbladder without cholecystitis without obstruction: Secondary | ICD-10-CM | POA: Insufficient documentation

## 2018-04-13 DIAGNOSIS — O26899 Other specified pregnancy related conditions, unspecified trimester: Secondary | ICD-10-CM | POA: Diagnosis not present

## 2018-04-13 DIAGNOSIS — R109 Unspecified abdominal pain: Secondary | ICD-10-CM

## 2018-04-13 DIAGNOSIS — O99619 Diseases of the digestive system complicating pregnancy, unspecified trimester: Secondary | ICD-10-CM | POA: Insufficient documentation

## 2018-04-13 DIAGNOSIS — R1010 Upper abdominal pain, unspecified: Secondary | ICD-10-CM | POA: Diagnosis present

## 2018-04-13 LAB — INTEGRATED 2
AFP MoM: 1.21
Alpha-Fetoprotein: 36.3 ng/mL
Crown Rump Length: 73.3 mm
DIA MoM: 0.84
DIA Value: 122.2 pg/mL
Estriol, Unconjugated: 1.18 ng/mL
Gest. Age on Collection Date: 13.3 weeks
Gestational Age: 17.3 weeks
HCG MOM: 1.19
HCG VALUE: 28.7 [IU]/mL
Maternal Age at EDD: 25.7 yr
NUCHAL TRANSLUCENCY MOM: 0.83
NUMBER OF FETUSES: 1
Nuchal Translucency (NT): 1.4 mm
PAPP-A MOM: 0.48
PAPP-A Value: 427.3 ng/mL
TEST RESULTS: NEGATIVE
WEIGHT: 194 [lb_av]
Weight: 195 [lb_av]
uE3 MoM: 1.18

## 2018-04-13 NOTE — Telephone Encounter (Signed)
Patient states she needs anatomy scan next week before she goes out of town. Informed patient we did not have any appt available next week so we would have to push her out until the following week.  Stated she was going out of town but would try to make it work.

## 2018-04-14 ENCOUNTER — Encounter (INDEPENDENT_AMBULATORY_CARE_PROVIDER_SITE_OTHER): Payer: Self-pay

## 2018-04-14 ENCOUNTER — Encounter: Payer: Self-pay | Admitting: Advanced Practice Midwife

## 2018-04-14 DIAGNOSIS — K8020 Calculus of gallbladder without cholecystitis without obstruction: Secondary | ICD-10-CM | POA: Insufficient documentation

## 2018-04-24 ENCOUNTER — Ambulatory Visit (INDEPENDENT_AMBULATORY_CARE_PROVIDER_SITE_OTHER): Payer: Medicaid Other

## 2018-04-24 DIAGNOSIS — Z8759 Personal history of other complications of pregnancy, childbirth and the puerperium: Secondary | ICD-10-CM

## 2018-04-24 DIAGNOSIS — Z363 Encounter for antenatal screening for malformations: Secondary | ICD-10-CM

## 2018-04-24 DIAGNOSIS — Z3402 Encounter for supervision of normal first pregnancy, second trimester: Secondary | ICD-10-CM

## 2018-04-24 NOTE — Progress Notes (Signed)
US 19+1 wks,cephalic,cx 4 cm,normal ovaries bilat,anterior fundal placenta gr 0,svp of fluid 5.1 cm,fhr 154 bpm,EFW 270 g 38%,anatomy complete,no obvious abnormalities

## 2018-05-09 ENCOUNTER — Encounter: Payer: Self-pay | Admitting: Obstetrics and Gynecology

## 2018-05-09 ENCOUNTER — Other Ambulatory Visit: Payer: Self-pay

## 2018-05-09 ENCOUNTER — Ambulatory Visit (INDEPENDENT_AMBULATORY_CARE_PROVIDER_SITE_OTHER): Payer: Medicaid Other | Admitting: Obstetrics and Gynecology

## 2018-05-09 VITALS — BP 123/77 | HR 77 | Wt 202.8 lb

## 2018-05-09 DIAGNOSIS — Z331 Pregnant state, incidental: Secondary | ICD-10-CM

## 2018-05-09 DIAGNOSIS — Z3482 Encounter for supervision of other normal pregnancy, second trimester: Secondary | ICD-10-CM

## 2018-05-09 DIAGNOSIS — Z3A21 21 weeks gestation of pregnancy: Secondary | ICD-10-CM

## 2018-05-09 DIAGNOSIS — Z1389 Encounter for screening for other disorder: Secondary | ICD-10-CM

## 2018-05-09 LAB — POCT URINALYSIS DIPSTICK OB
Blood, UA: NEGATIVE
Glucose, UA: NEGATIVE — AB
Ketones, UA: NEGATIVE
Leukocytes, UA: NEGATIVE
Nitrite, UA: NEGATIVE
POC,PROTEIN,UA: NEGATIVE

## 2018-05-09 NOTE — Progress Notes (Signed)
Patient ID: Sheila Jenkins Gannett, female   DOB: [birth year], 27 y.o.   MRN: [id number]    LOW-RISK PREGNANCY VISIT Patient name: Sheila Jenkins Bi MRN [id number]  Date of birth: [birth year] Chief Complaint:   Routine Prenatal Visit  History of Present Illness:   Sheila Jenkins Gruszka is a 27 y.o. [id number] female at 356w2d with an Estimated Date of Delivery: 09/17/18 being seen today for ongoing management of a low-risk pregnancy. She is still having gallbladder issues and has had some relief of symptoms. This is her third pregnancy and would like her tubes tied after delivery. Today she reports no complaints.  . Vag. Bleeding: None.  Movement: Present. denies leaking of fluid. Review of Systems:   Pertinent items are noted in HPI Denies abnormal vaginal discharge w/ itching/odor/irritation, headaches, visual changes, shortness of breath, chest pain, abdominal pain, severe nausea/vomiting, or problems with urination or bowel movements unless otherwise stated above. Pertinent History Reviewed:  Reviewed past medical,surgical, social, obstetrical and family history.  Reviewed problem list, medications and allergies. Physical Assessment:   Vitals:   05/09/18 1336  BP: 123/77  Pulse: 77  Weight: 202 lb 12.8 oz (92 kg)  Body mass index is 34.81 kg/m.        Physical Examination:   General appearance: Well appearing, and in no distress  Mental status: Alert, oriented to person, place, and time  Skin: Warm & dry  Cardiovascular: Normal heart rate noted  Respiratory: Normal respiratory effort, no distress  Abdomen: Soft, gravid, nontender  Pelvic: Cervical exam deferred         Extremities: Edema: Trace  Fetal Status: Fetal Heart Rate (bpm): 152   Movement: Present    Results for orders placed or performed in visit on 05/09/18 (from the past 24 hour(s))  POC Urinalysis Dipstick OB   Collection Time: 05/09/18  1:36 PM  Result Value Ref Range   Color, UA     Clarity, UA     Glucose, UA Negative (A)  (none)   Bilirubin, UA     Ketones, UA neg    Spec Grav, UA     Blood, UA neg    pH, UA     POC Protein UA Negative Negative, Trace   Urobilinogen, UA     Nitrite, UA neg    Leukocytes, UA Negative Negative   Appearance     Odor      Assessment & Plan:  1) Low-risk pregnancy [id number] at 256w2d with an Estimated Date of Delivery: 09/17/18     Meds: No orders of the defined types were placed in this encounter.  Labs/procedures today: None  Plan:   1.Continue routine obstetrical care 2. F/u in 5 weeks for PN2 and 3 Contraception method sign tubal consent  Follow-up: Return in about 5 weeks (around 06/13/2018) for LROB, sign BLT consent today.  Orders Placed This Encounter  Procedures  . POC Urinalysis Dipstick OB   By signing my name below, I, Arnette NorrisMari Johnson, attest that this documentation has been prepared under the direction and in the presence of Tilda BurrowFerguson, Carlota Philley V, MD. Electronically Signed: Arnette NorrisMari Johnson Medical Scribe. 05/09/18. 2:02 PM.  I personally performed the services described in this documentation, which was SCRIBED in my presence. The recorded information has been reviewed and considered accurate. It has been edited as necessary during review. Tilda BurrowJohn V Kendel Pesnell, MD

## 2018-05-18 ENCOUNTER — Encounter: Payer: Self-pay | Admitting: *Deleted

## 2018-05-18 ENCOUNTER — Telehealth: Payer: Self-pay | Admitting: *Deleted

## 2018-05-18 MED ORDER — PROMETHAZINE HCL 25 MG PO TABS: 12.5000 mg | ORAL_TABLET | Freq: Four times a day (QID) | ORAL | 0 refills | Status: DC | PRN

## 2018-05-18 NOTE — Telephone Encounter (Signed)
Patient states she is having nausea and vomiting with no relief from Zofran.  Requesting a different nausea medication. Please advise.

## 2018-05-18 NOTE — Telephone Encounter (Signed)
Pt notified Phenergan sent.

## 2018-06-04 ENCOUNTER — Telehealth: Payer: Self-pay | Admitting: Obstetrics & Gynecology

## 2018-06-04 MED ORDER — ONDANSETRON 4 MG PO TBDP: 4.0000 mg | ORAL_TABLET | Freq: Four times a day (QID) | ORAL | 2 refills | Status: DC | PRN

## 2018-06-04 NOTE — Telephone Encounter (Signed)
Patient informed Zofran was sent. Advised to go to [facility] if unable to keep fluids down.  Verbalized understanding.

## 2018-06-04 NOTE — Telephone Encounter (Signed)
Patient states for the past 3 days she has been having extreme nausea and vomiting.Can't hold down fluids or any medication.  She is concerned it is related to her gallbladder.  She is also asking for refill on Zofran.  Please advise.

## 2018-06-13 ENCOUNTER — Encounter: Payer: Medicaid Other | Admitting: Advanced Practice Midwife

## 2018-06-13 ENCOUNTER — Ambulatory Visit (INDEPENDENT_AMBULATORY_CARE_PROVIDER_SITE_OTHER): Payer: Medicaid Other | Admitting: Advanced Practice Midwife

## 2018-06-13 ENCOUNTER — Encounter: Payer: Self-pay | Admitting: Advanced Practice Midwife

## 2018-06-13 VITALS — BP 113/75 | HR 71 | Wt 211.0 lb

## 2018-06-13 DIAGNOSIS — Z3A26 26 weeks gestation of pregnancy: Secondary | ICD-10-CM

## 2018-06-13 DIAGNOSIS — Z3482 Encounter for supervision of other normal pregnancy, second trimester: Secondary | ICD-10-CM

## 2018-06-13 DIAGNOSIS — Z1389 Encounter for screening for other disorder: Secondary | ICD-10-CM

## 2018-06-13 DIAGNOSIS — Z331 Pregnant state, incidental: Secondary | ICD-10-CM

## 2018-06-13 LAB — POCT URINALYSIS DIPSTICK OB
Blood, UA: NEGATIVE
Glucose, UA: NEGATIVE
Leukocytes, UA: NEGATIVE
NITRITE UA: NEGATIVE
PROTEIN: NEGATIVE

## 2018-06-13 MED ORDER — OMEPRAZOLE 20 MG PO CPDR: 20.0000 mg | DELAYED_RELEASE_CAPSULE | Freq: Every day | ORAL | 6 refills | Status: DC

## 2018-06-13 MED ORDER — PROMETHAZINE HCL 12.5 MG RE SUPP: 12.5000 mg | Freq: Four times a day (QID) | RECTAL | 3 refills | Status: DC | PRN

## 2018-06-13 NOTE — Progress Notes (Signed)
  [id number] 5477w2d Estimated Date of Delivery: 09/17/18  Blood pressure 113/75, pulse 71, weight 211 lb (95.7 kg), last menstrual period 11/24/2017.   BP weight and urine results all reviewed and noted.  Please refer to the obstetrical flow sheet for the fundal height and fetal heart rate documentation:  Patient reports good fetal movement, denies any bleeding and no rupture of membranes symptoms or regular contractions.  Heartburn is getting bad, still vomits but zofran helps some  Sugary stuff makes her gallbladder hurt All questions were answered.   Physical Assessment:   Vitals:   06/13/18 1352  BP: 113/75  Pulse: 71  Weight: 211 lb (95.7 kg)  Body mass index is 36.22 kg/m.        Physical Examination:   General appearance: Well appearing, and in no distress  Mental status: Alert, oriented to person, place, and time  Skin: Warm & dry  Cardiovascular: Normal heart rate noted  Respiratory: Normal respiratory effort, no distress  Abdomen: Soft, gravid, nontender  Pelvic: Cervical exam deferred         Extremities: Edema: Trace  Fetal Status:     Movement: Present    Results for orders placed or performed in visit on 06/13/18 (from the past 24 hour(s))  POC Urinalysis Dipstick OB   Collection Time: 06/13/18  1:53 PM  Result Value Ref Range   Color, UA     Clarity, UA     Glucose, UA Negative Negative   Bilirubin, UA     Ketones, UA 1+    Spec Grav, UA     Blood, UA neg    pH, UA     POC Protein UA Negative Negative, Trace   Urobilinogen, UA     Nitrite, UA neg    Leukocytes, UA Negative Negative   Appearance     Odor       Orders Placed This Encounter  Procedures  . POC Urinalysis Dipstick OB    Plan:  Continued routine obstetrical care, rx phenergan,  Will need to do sprite w/PN2 d/t sugar making her sick  Return in about 2 weeks (around 06/27/2018) for PN2/LROB.

## 2018-06-13 NOTE — Patient Instructions (Signed)

## 2018-06-27 ENCOUNTER — Encounter: Payer: Self-pay | Admitting: Advanced Practice Midwife

## 2018-06-27 ENCOUNTER — Ambulatory Visit (INDEPENDENT_AMBULATORY_CARE_PROVIDER_SITE_OTHER): Payer: Medicaid Other | Admitting: Advanced Practice Midwife

## 2018-06-27 ENCOUNTER — Other Ambulatory Visit: Payer: Self-pay

## 2018-06-27 ENCOUNTER — Other Ambulatory Visit: Payer: Medicaid Other

## 2018-06-27 VITALS — BP 128/74 | HR 94 | Wt 215.0 lb

## 2018-06-27 DIAGNOSIS — Z1389 Encounter for screening for other disorder: Secondary | ICD-10-CM

## 2018-06-27 DIAGNOSIS — Z23 Encounter for immunization: Secondary | ICD-10-CM

## 2018-06-27 DIAGNOSIS — Z3483 Encounter for supervision of other normal pregnancy, third trimester: Secondary | ICD-10-CM

## 2018-06-27 DIAGNOSIS — Z3A28 28 weeks gestation of pregnancy: Secondary | ICD-10-CM

## 2018-06-27 DIAGNOSIS — Z131 Encounter for screening for diabetes mellitus: Secondary | ICD-10-CM

## 2018-06-27 DIAGNOSIS — Z331 Pregnant state, incidental: Secondary | ICD-10-CM

## 2018-06-27 LAB — POCT URINALYSIS DIPSTICK OB
Blood, UA: NEGATIVE
GLUCOSE, UA: NEGATIVE
Ketones, UA: NEGATIVE
Leukocytes, UA: NEGATIVE
Nitrite, UA: NEGATIVE
POC,PROTEIN,UA: NEGATIVE

## 2018-06-27 MED ORDER — BUTALBITAL-APAP-CAFFEINE 50-325-40 MG PO TABS: 1.0000 | ORAL_TABLET | Freq: Four times a day (QID) | ORAL | 0 refills | Status: DC | PRN

## 2018-06-27 NOTE — Patient Instructions (Signed)
Sheila Jenkins, I greatly value your feedback.  If you receive a survey following your visit with [place] today, we appreciate you taking the time to fill it out.  Thanks, Cathie Beams, CNM   Call the office [phone]) or go to [facility] if:  You begin to have strong, frequent contractions  Your water breaks.  Sometimes it is a big gush of fluid, sometimes it is just a trickle that keeps getting your panties wet or running down your legs  You have vaginal bleeding.  It is normal to have a small amount of spotting if your cervix was checked.   You don't feel your baby moving like normal.  If you don't, get you something to eat and drink and lay down and focus on feeling your baby move.  You should feel at least 10 movements in 2 hours.  If you don't, you should call the office or go to [facility].    Tdap Vaccine  It is recommended that you get the Tdap vaccine during the third trimester of EACH pregnancy to help protect your baby from getting pertussis (whooping cough)  27-36 weeks is the BEST time to do this so that you can pass the protection on to your baby. During pregnancy is better than after pregnancy, but if you are unable to get it during pregnancy it will be offered at the hospital.   You will be offered this vaccine in the office after 27 weeks. If you do not have health insurance, you can get this vaccine at the health department or your family doctor  Everyone who will be around your baby should also be up-to-date on their vaccines. Adults (who are not pregnant) only need 1 dose of Tdap during adulthood.   Third Trimester of Pregnancy The third trimester is from week 29 through week 42, months 7 through 9. The third trimester is a time when the fetus is growing rapidly. At the end of the ninth month, the fetus is about 20 inches in length and weighs 6-10 pounds.  BODY CHANGES Your body goes through many changes during pregnancy. The changes vary from woman  to woman.   Your weight will continue to increase. You can expect to gain 25-35 pounds (11-16 kg) by the end of the pregnancy.  You may begin to get stretch marks on your hips, abdomen, and breasts.  You may urinate more often because the fetus is moving lower into your pelvis and pressing on your bladder.  You may develop or continue to have heartburn as a result of your pregnancy.  You may develop constipation because certain hormones are causing the muscles that push waste through your intestines to slow down.  You may develop hemorrhoids or swollen, bulging veins (varicose veins).  You may have pelvic pain because of the weight gain and pregnancy hormones relaxing your joints between the bones in your pelvis. Backaches may result from overexertion of the muscles supporting your posture.  You may have changes in your hair. These can include thickening of your hair, rapid growth, and changes in texture. Some women also have hair loss during or after pregnancy, or hair that feels dry or thin. Your hair will most likely return to normal after your baby is born.  Your breasts will continue to grow and be tender. A yellow discharge may leak from your breasts called colostrum.  Your belly button may stick out.  You may feel short of breath because of your expanding uterus.  You  may notice the fetus "dropping," or moving lower in your abdomen.  You may have a bloody mucus discharge. This usually occurs a few days to a week before labor begins.  Your cervix becomes thin and soft (effaced) near your due date. WHAT TO EXPECT AT YOUR PRENATAL EXAMS  You will have prenatal exams every 2 weeks until week 36. Then, you will have weekly prenatal exams. During a routine prenatal visit:  You will be weighed to make sure you and the fetus are growing normally.  Your blood pressure is taken.  Your abdomen will be measured to track your baby's growth.  The fetal heartbeat will be listened  to.  Any test results from the previous visit will be discussed.  You may have a cervical check near your due date to see if you have effaced. At around 36 weeks, your caregiver will check your cervix. At the same time, your caregiver will also perform a test on the secretions of the vaginal tissue. This test is to determine if a type of bacteria, Group B streptococcus, is present. Your caregiver will explain this further. Your caregiver may ask you:  What your birth plan is.  How you are feeling.  If you are feeling the baby move.  If you have had any abnormal symptoms, such as leaking fluid, bleeding, severe headaches, or abdominal cramping.  If you have any questions. Other tests or screenings that may be performed during your third trimester include:  Blood tests that check for low iron levels (anemia).  Fetal testing to check the health, activity level, and growth of the fetus. Testing is done if you have certain medical conditions or if there are problems during the pregnancy. FALSE LABOR You may feel small, irregular contractions that eventually go away. These are called Braxton Hicks contractions, or false labor. Contractions may last for hours, days, or even weeks before true labor sets in. If contractions come at regular intervals, intensify, or become painful, it is best to be seen by your caregiver.  SIGNS OF LABOR   Menstrual-like cramps.  Contractions that are 5 minutes apart or less.  Contractions that start on the top of the uterus and spread down to the lower abdomen and back.  A sense of increased pelvic pressure or back pain.  A watery or bloody mucus discharge that comes from the vagina. If you have any of these signs before the 37th week of pregnancy, call your caregiver right away. You need to go to the hospital to get checked immediately. HOME CARE INSTRUCTIONS   Avoid all smoking, herbs, alcohol, and unprescribed drugs. These chemicals affect the  formation and growth of the baby.  Follow your caregiver's instructions regarding medicine use. There are medicines that are either safe or unsafe to take during pregnancy.  Exercise only as directed by your caregiver. Experiencing uterine cramps is a good sign to stop exercising.  Continue to eat regular, healthy meals.  Wear a good support bra for breast tenderness.  Do not use hot tubs, steam rooms, or saunas.  Wear your seat belt at all times when driving.  Avoid raw meat, uncooked cheese, cat litter boxes, and soil used by cats. These carry germs that can cause birth defects in the baby.  Take your prenatal vitamins.  Try taking a stool softener (if your caregiver approves) if you develop constipation. Eat more high-fiber foods, such as fresh vegetables or fruit and whole grains. Drink plenty of fluids to keep your urine clear  or pale yellow.  Take warm sitz baths to soothe any pain or discomfort caused by hemorrhoids. Use hemorrhoid cream if your caregiver approves.  If you develop varicose veins, wear support hose. Elevate your feet for 15 minutes, 3-4 times a day. Limit salt in your diet.  Avoid heavy lifting, wear low heal shoes, and practice good posture.  Rest a lot with your legs elevated if you have leg cramps or low back pain.  Visit your dentist if you have not gone during your pregnancy. Use a soft toothbrush to brush your teeth and be gentle when you floss.  A sexual relationship may be continued unless your caregiver directs you otherwise.  Do not travel far distances unless it is absolutely necessary and only with the approval of your caregiver.  Take prenatal classes to understand, practice, and ask questions about the labor and delivery.  Make a trial run to the hospital.  Pack your hospital bag.  Prepare the baby's nursery.  Continue to go to all your prenatal visits as directed by your caregiver. SEEK MEDICAL CARE IF:  You are unsure if you are in  labor or if your water has broken.  You have dizziness.  You have mild pelvic cramps, pelvic pressure, or nagging pain in your abdominal area.  You have persistent nausea, vomiting, or diarrhea.  You have a bad smelling vaginal discharge.  You have pain with urination. SEEK IMMEDIATE MEDICAL CARE IF:   You have a fever.  You are leaking fluid from your vagina.  You have spotting or bleeding from your vagina.  You have severe abdominal cramping or pain.  You have rapid weight loss or gain.  You have shortness of breath with chest pain.  You notice sudden or extreme swelling of your face, hands, ankles, feet, or legs.  You have not felt your baby move in over an hour.  You have severe headaches that do not go away with medicine.  You have vision changes. Document Released: 09/06/2001 Document Revised: 09/17/2013 Document Reviewed: 11/13/2012 [facility] Patient Information 2015 [place], [place]. This information is not intended to replace advice given to you by your health care provider. Make sure you discuss any questions you have with your health care provider.

## 2018-06-27 NOTE — Progress Notes (Signed)
  [id number] [redacted]w[redacted]d Estimated Date of Delivery: 09/17/18  Blood pressure 128/74, pulse 94, weight 215 lb (97.5 kg), last menstrual period 11/24/2017.   BP weight and urine results all reviewed and noted.  Please refer to the obstetrical flow sheet for the fundal height and fetal heart rate documentation:  Patient reports good fetal movement, denies any bleeding and no rupture of membranes symptoms or regular contractions. Patient c/o frontal HAs, tylenol not helping.  All questions were answered.   Physical Assessment:   Vitals:   06/27/18 0908  BP: 128/74  Pulse: 94  Weight: 215 lb (97.5 kg)  Body mass index is 36.9 kg/m.        Physical Examination:   General appearance: Well appearing, and in no distress  Mental status: Alert, oriented to person, place, and time  Skin: Warm & dry  Cardiovascular: Normal heart rate noted  Respiratory: Normal respiratory effort, no distress  Abdomen: Soft, gravid, nontender  Pelvic: Cervical exam deferred         Extremities: Edema: Trace  Fetal Status:     Movement: Present    Results for orders placed or performed in visit on 06/27/18 (from the past 24 hour(s))  POC Urinalysis Dipstick OB   Collection Time: 06/27/18  9:07 AM  Result Value Ref Range   Color, UA     Clarity, UA     Glucose, UA Negative Negative   Bilirubin, UA     Ketones, UA neg    Spec Grav, UA     Blood, UA neg    pH, UA     POC Protein UA Negative Negative, Trace   Urobilinogen, UA     Nitrite, UA neg    Leukocytes, UA Negative Negative   Appearance     Odor       Orders Placed This Encounter  Procedures  . Tdap vaccine greater than or equal to 7yo IM  . POC Urinalysis Dipstick OB    Plan:  Continued routine obstetrical care,   Return in about 3 weeks (around 07/18/2018) for LROB.

## 2018-06-28 LAB — HIV ANTIBODY (ROUTINE TESTING W REFLEX): HIV SCREEN 4TH GENERATION: NONREACTIVE

## 2018-06-28 LAB — CBC
HEMOGLOBIN: 12.2 g/dL (ref 11.1–15.9)
Hematocrit: 35.8 % (ref 34.0–46.6)
MCH: 30.2 pg (ref 26.6–33.0)
MCHC: 34.1 g/dL (ref 31.5–35.7)
MCV: 89 fL (ref 79–97)
PLATELETS: 261 10*3/uL (ref 150–450)
RBC: 4.04 x10E6/uL (ref 3.77–5.28)
RDW: 12.1 % — AB (ref 12.3–15.4)
WBC: 10 10*3/uL (ref 3.4–10.8)

## 2018-06-28 LAB — GLUCOSE TOLERANCE, 2 HOURS W/ 1HR
GLUCOSE, 1 HOUR: 128 mg/dL (ref 65–179)
GLUCOSE, 2 HOUR: 97 mg/dL (ref 65–152)
GLUCOSE, FASTING: 77 mg/dL (ref 65–91)

## 2018-06-28 LAB — ANTIBODY SCREEN: ANTIBODY SCREEN: NEGATIVE

## 2018-06-28 LAB — RPR: RPR: NONREACTIVE

## 2018-06-30 IMAGING — US US OB TRANSVAGINAL
1 series · 13 of 28 positions shown · non-contrast
Comparison: None.

CLINICAL DATA: Pregnant patient with bleeding

EXAM:
OBSTETRIC <14 WK US AND TRANSVAGINAL OB US
TECHNIQUE: Both transabdominal and transvaginal ultrasound examinations were
performed for complete evaluation of the gestation as well as the
maternal uterus, adnexal regions, and pelvic cul-de-sac.
Transvaginal technique was performed to assess early pregnancy.

[Series 1: us ob transvaginal · 0.25mm/px · 13 of 54 slices shown]
[im 2/54]
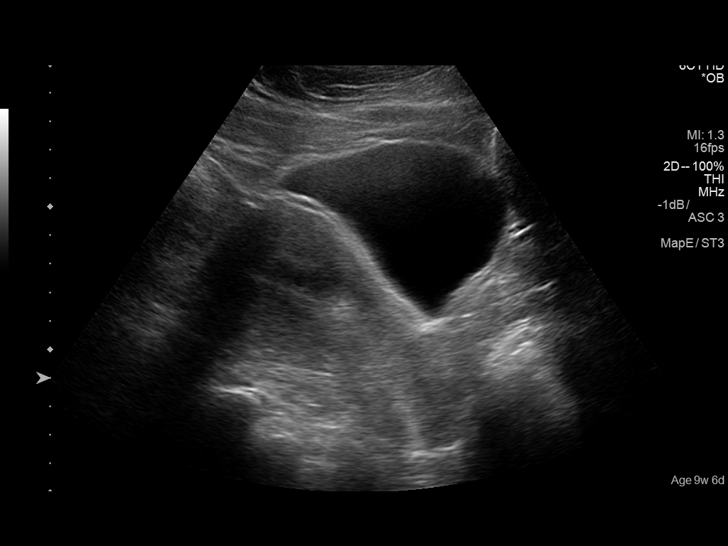
[im 6/54]
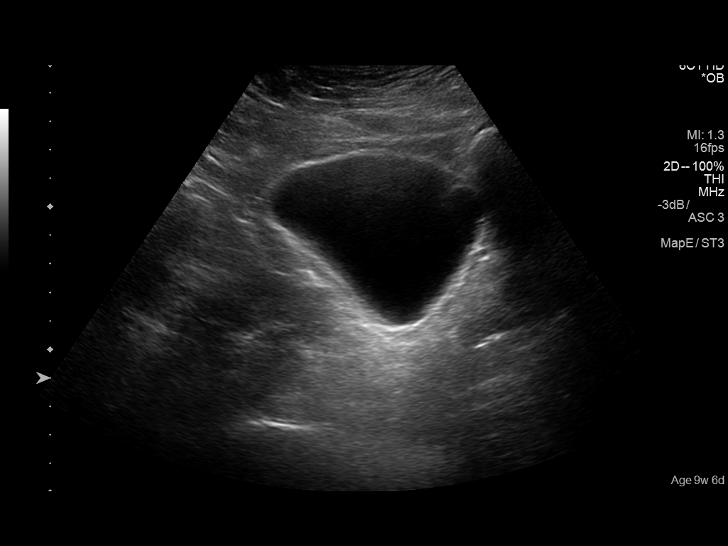
[im 10/54]
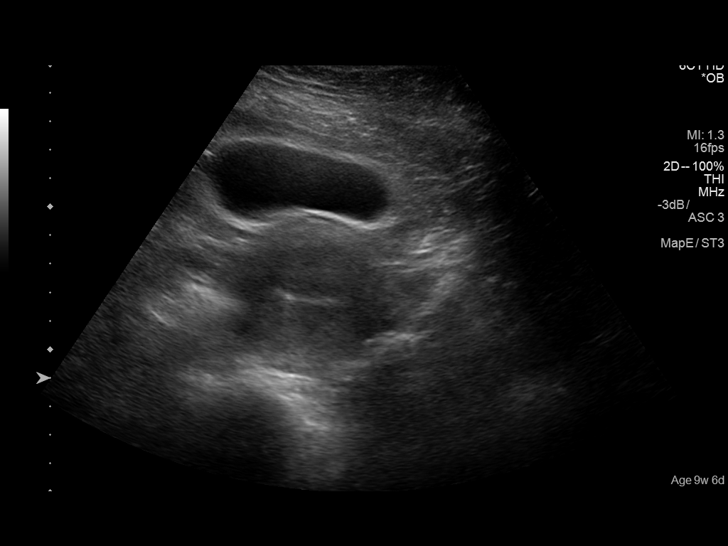
[im 14/54]
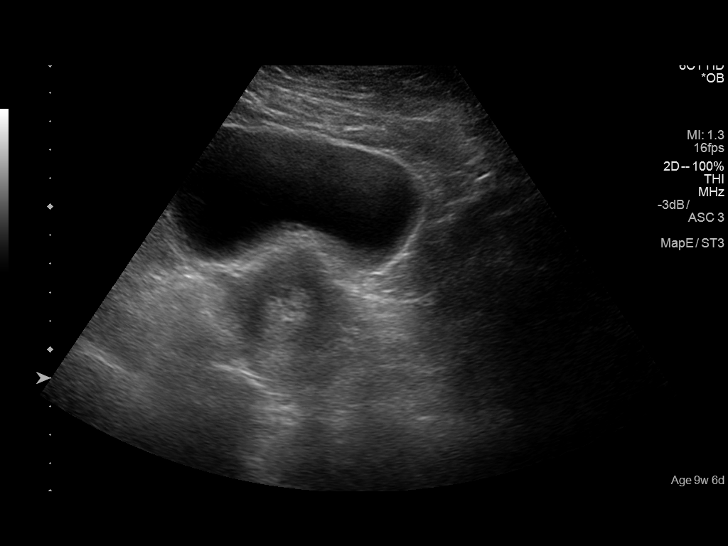
[im 18/54]
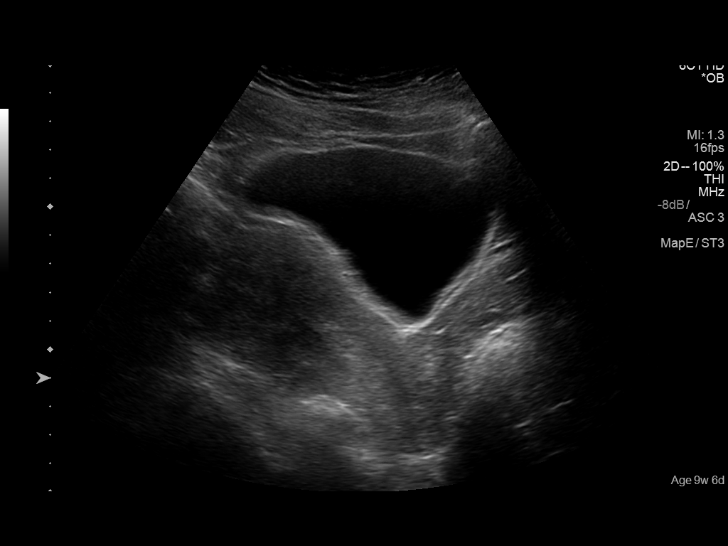
[im 22/54]
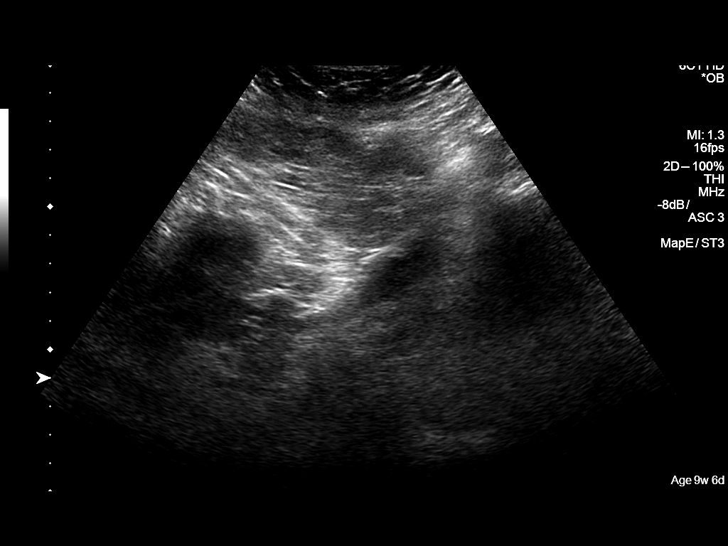
[im 28/54]
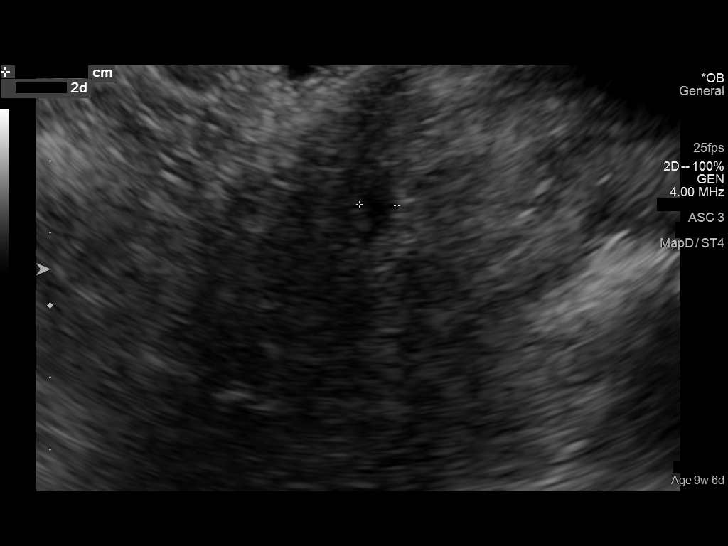
[im 32/54]
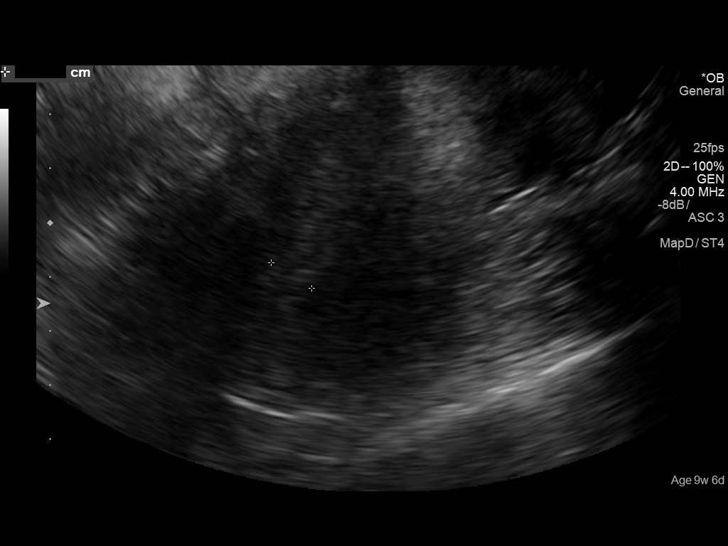
[im 36/54]
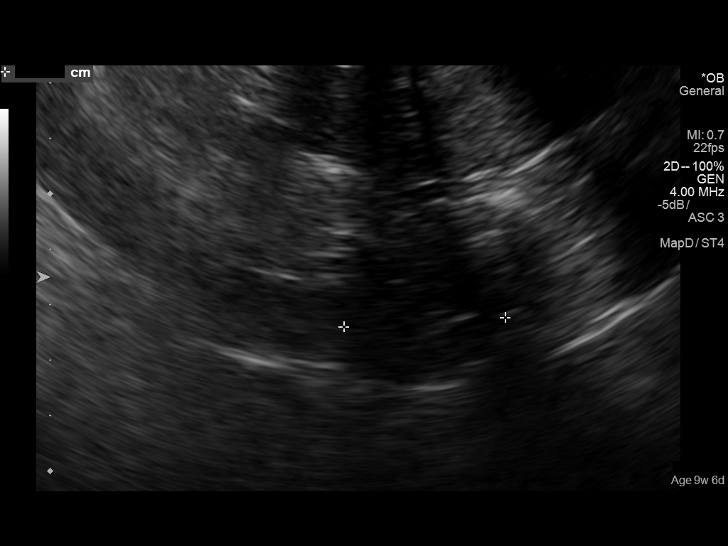
[im 40/54]
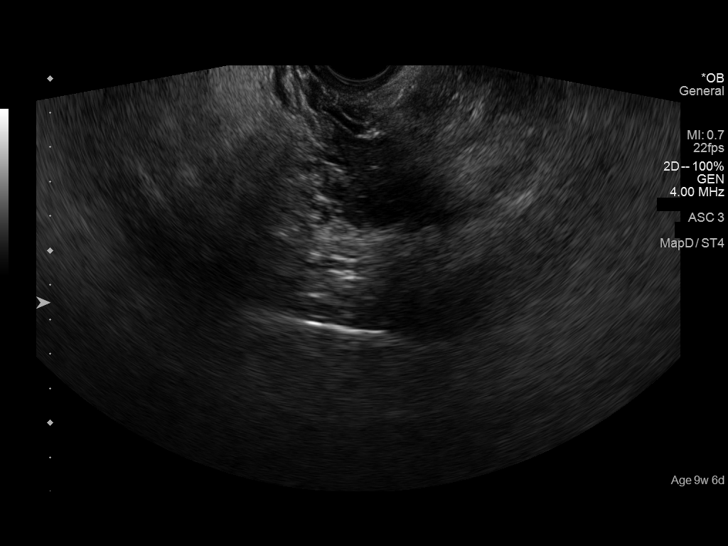
[im 44/54]
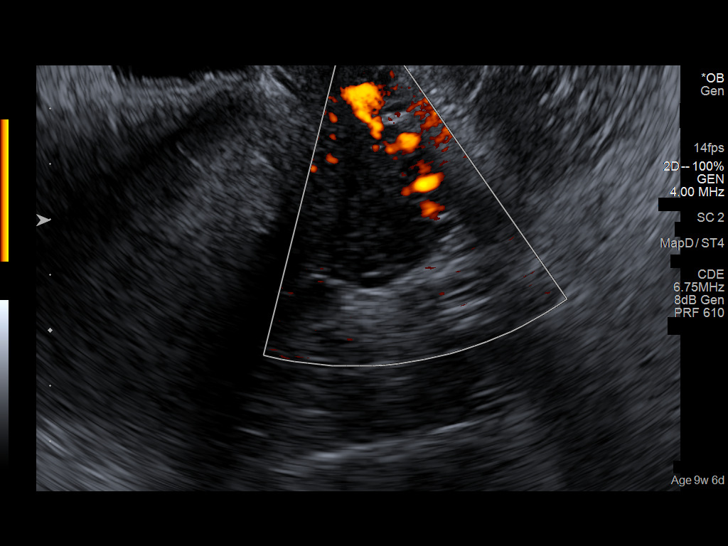
[im 48/54]
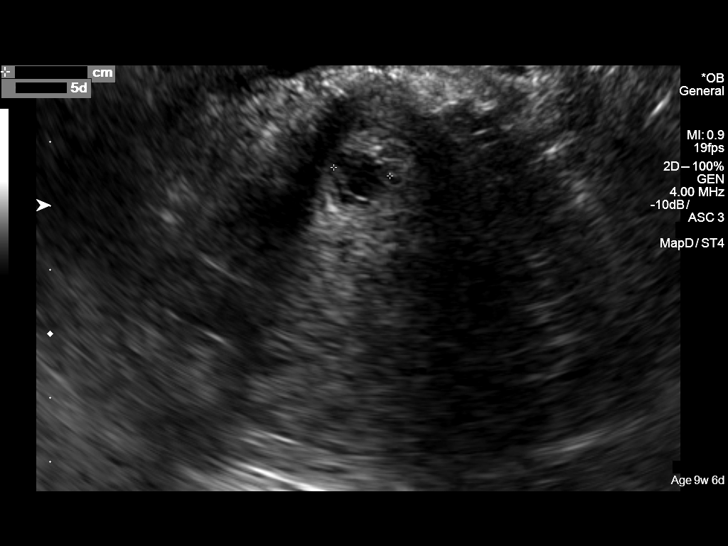
[im 52/54]
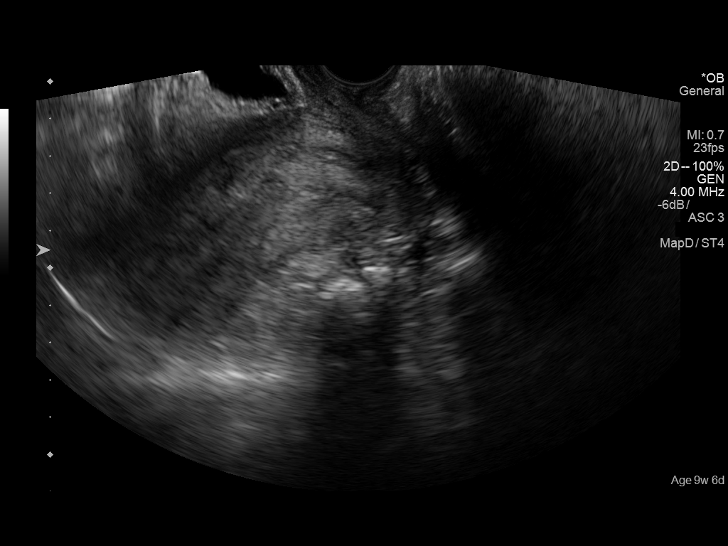

[13 of 28 positions shown; findings below may reference images not displayed]

FINDINGS: Intrauterine gestational sac: An oval mildly irregular fluid
collection is seen in the lower uterine segment endometrial canal.
This is nonspecific but could represent an abnormal gestational sac.

Yolk sac:  Not Visualized.

Embryo:  Not Visualized.

MSD: 6.9  mm   5 w   3  d

CRL:    mm    w    d                  US EDC:

Subchorionic hemorrhage:  None visualized.

Maternal uterus/adnexae:
IMPRESSION: 1. A normal intrauterine pregnancy is not identified. In a pregnant
patient, the lack of an intrauterine pregnancy could be seen with
ectopic pregnancy, early pregnancy, or recent miscarriage. The
mildly irregular oval fluid collection in the lower uterine segment
could represent a miscarriage in process but is nonspecific.
Recommend clinical correlation and follow-up as clinically
warranted.

## 2018-07-23 ENCOUNTER — Ambulatory Visit (INDEPENDENT_AMBULATORY_CARE_PROVIDER_SITE_OTHER): Payer: Medicaid Other | Admitting: Women's Health

## 2018-07-23 ENCOUNTER — Encounter: Payer: Self-pay | Admitting: Women's Health

## 2018-07-23 VITALS — BP 130/82 | HR 87 | Wt 222.0 lb

## 2018-07-23 DIAGNOSIS — Z8759 Personal history of other complications of pregnancy, childbirth and the puerperium: Secondary | ICD-10-CM

## 2018-07-23 DIAGNOSIS — R030 Elevated blood-pressure reading, without diagnosis of hypertension: Secondary | ICD-10-CM

## 2018-07-23 DIAGNOSIS — Z3483 Encounter for supervision of other normal pregnancy, third trimester: Secondary | ICD-10-CM

## 2018-07-23 DIAGNOSIS — Z331 Pregnant state, incidental: Secondary | ICD-10-CM

## 2018-07-23 DIAGNOSIS — Z1389 Encounter for screening for other disorder: Secondary | ICD-10-CM

## 2018-07-23 DIAGNOSIS — Z3A32 32 weeks gestation of pregnancy: Secondary | ICD-10-CM

## 2018-07-23 LAB — POCT URINALYSIS DIPSTICK OB
Blood, UA: NEGATIVE
Glucose, UA: NEGATIVE
KETONES UA: NEGATIVE
Leukocytes, UA: NEGATIVE
NITRITE UA: NEGATIVE
PROTEIN: NEGATIVE

## 2018-07-23 MED ORDER — BUTALBITAL-APAP-CAFFEINE 50-325-40 MG PO TABS: 1.0000 | ORAL_TABLET | Freq: Four times a day (QID) | ORAL | 0 refills | Status: DC | PRN

## 2018-07-23 NOTE — Patient Instructions (Addendum)
Judd Gaudier, I greatly value your feedback.  If you receive a survey following your visit with [place] today, we appreciate you taking the time to fill it out.  Thanks, Joellyn Haff, CNM, WHNP-BC  Check your blood pressure 4 times daily, >=140 on top or >=90 on bottom consistently call and let us know   Call the office [phone]) or go to [facility] if:  You begin to have strong, frequent contractions  Your water breaks.  Sometimes it is a big gush of fluid, sometimes it is just a trickle that keeps getting your panties wet or running down your legs  You have vaginal bleeding.  It is normal to have a small amount of spotting if your cervix was checked.   You don't feel your baby moving like normal.  If you don't, get you something to eat and drink and lay down and focus on feeling your baby move.  You should feel at least 10 movements in 2 hours.  If you don't, you should call the office or go to [facility].    Call the office [phone]) or go to [facility] hospital for these signs of pre-eclampsia:  Severe headache that does not go away with Tylenol  Visual changes- seeing spots, double, blurred vision  Pain under your right breast or upper abdomen that does not go away with Tums or heartburn medicine  Nausea and/or vomiting  Severe swelling in your hands, feet, and face    Third Trimester of Pregnancy The third trimester is from week 29 through week 42, months 7 through 9. The third trimester is a time when the fetus is growing rapidly. At the end of the ninth month, the fetus is about 20 inches in length and weighs 6-10 pounds.  BODY CHANGES Your body goes through many changes during pregnancy. The changes vary from woman to woman.   Your weight will continue to increase. You can expect to gain 25-35 pounds (11-16 kg) by the end of the pregnancy.  You may begin to get stretch marks on your hips, abdomen, and breasts.  You may urinate more often because the fetus is  moving lower into your pelvis and pressing on your bladder.  You may develop or continue to have heartburn as a result of your pregnancy.  You may develop constipation because certain hormones are causing the muscles that push waste through your intestines to slow down.  You may develop hemorrhoids or swollen, bulging veins (varicose veins).  You may have pelvic pain because of the weight gain and pregnancy hormones relaxing your joints between the bones in your pelvis. Backaches may result from overexertion of the muscles supporting your posture.  You may have changes in your hair. These can include thickening of your hair, rapid growth, and changes in texture. Some women also have hair loss during or after pregnancy, or hair that feels dry or thin. Your hair will most likely return to normal after your baby is born.  Your breasts will continue to grow and be tender. A yellow discharge may leak from your breasts called colostrum.  Your belly button may stick out.  You may feel short of breath because of your expanding uterus.  You may notice the fetus "dropping," or moving lower in your abdomen.  You may have a bloody mucus discharge. This usually occurs a few days to a week before labor begins.  Your cervix becomes thin and soft (effaced) near your due date. WHAT TO EXPECT AT YOUR PRENATAL  EXAMS  You will have prenatal exams every 2 weeks until week 36. Then, you will have weekly prenatal exams. During a routine prenatal visit:  You will be weighed to make sure you and the fetus are growing normally.  Your blood pressure is taken.  Your abdomen will be measured to track your baby's growth.  The fetal heartbeat will be listened to.  Any test results from the previous visit will be discussed.  You may have a cervical check near your due date to see if you have effaced. At around 36 weeks, your caregiver will check your cervix. At the same time, your caregiver will also perform a  test on the secretions of the vaginal tissue. This test is to determine if a type of bacteria, Group B streptococcus, is present. Your caregiver will explain this further. Your caregiver may ask you:  What your birth plan is.  How you are feeling.  If you are feeling the baby move.  If you have had any abnormal symptoms, such as leaking fluid, bleeding, severe headaches, or abdominal cramping.  If you have any questions. Other tests or screenings that may be performed during your third trimester include:  Blood tests that check for low iron levels (anemia).  Fetal testing to check the health, activity level, and growth of the fetus. Testing is done if you have certain medical conditions or if there are problems during the pregnancy. FALSE LABOR You may feel small, irregular contractions that eventually go away. These are called Braxton Hicks contractions, or false labor. Contractions may last for hours, days, or even weeks before true labor sets in. If contractions come at regular intervals, intensify, or become painful, it is best to be seen by your caregiver.  SIGNS OF LABOR   Menstrual-like cramps.  Contractions that are 5 minutes apart or less.  Contractions that start on the top of the uterus and spread down to the lower abdomen and back.  A sense of increased pelvic pressure or back pain.  A watery or bloody mucus discharge that comes from the vagina. If you have any of these signs before the 37th week of pregnancy, call your caregiver right away. You need to go to the hospital to get checked immediately. HOME CARE INSTRUCTIONS   Avoid all smoking, herbs, alcohol, and unprescribed drugs. These chemicals affect the formation and growth of the baby.  Follow your caregiver's instructions regarding medicine use. There are medicines that are either safe or unsafe to take during pregnancy.  Exercise only as directed by your caregiver. Experiencing uterine cramps is a good sign to  stop exercising.  Continue to eat regular, healthy meals.  Wear a good support bra for breast tenderness.  Do not use hot tubs, steam rooms, or saunas.  Wear your seat belt at all times when driving.  Avoid raw meat, uncooked cheese, cat litter boxes, and soil used by cats. These carry germs that can cause birth defects in the baby.  Take your prenatal vitamins.  Try taking a stool softener (if your caregiver approves) if you develop constipation. Eat more high-fiber foods, such as fresh vegetables or fruit and whole grains. Drink plenty of fluids to keep your urine clear or pale yellow.  Take warm sitz baths to soothe any pain or discomfort caused by hemorrhoids. Use hemorrhoid cream if your caregiver approves.  If you develop varicose veins, wear support hose. Elevate your feet for 15 minutes, 3-4 times a day. Limit salt in your diet.  Avoid heavy lifting, wear low heal shoes, and practice good posture.  Rest a lot with your legs elevated if you have leg cramps or low back pain.  Visit your dentist if you have not gone during your pregnancy. Use a soft toothbrush to brush your teeth and be gentle when you floss.  A sexual relationship may be continued unless your caregiver directs you otherwise.  Do not travel far distances unless it is absolutely necessary and only with the approval of your caregiver.  Take prenatal classes to understand, practice, and ask questions about the labor and delivery.  Make a trial run to the hospital.  Pack your hospital bag.  Prepare the baby's nursery.  Continue to go to all your prenatal visits as directed by your caregiver. SEEK MEDICAL CARE IF:  You are unsure if you are in labor or if your water has broken.  You have dizziness.  You have mild pelvic cramps, pelvic pressure, or nagging pain in your abdominal area.  You have persistent nausea, vomiting, or diarrhea.  You have a bad smelling vaginal discharge.  You have pain with  urination. SEEK IMMEDIATE MEDICAL CARE IF:   You have a fever.  You are leaking fluid from your vagina.  You have spotting or bleeding from your vagina.  You have severe abdominal cramping or pain.  You have rapid weight loss or gain.  You have shortness of breath with chest pain.  You notice sudden or extreme swelling of your face, hands, ankles, feet, or legs.  You have not felt your baby move in over an hour.  You have severe headaches that do not go away with medicine.  You have vision changes. Document Released: 09/06/2001 Document Revised: 09/17/2013 Document Reviewed: 11/13/2012 [facility] Patient Information 2015 [place], [place]. This information is not intended to replace advice given to you by your health care provider. Make sure you discuss any questions you have with your health care provider.

## 2018-07-23 NOTE — Progress Notes (Signed)
LOW-RISK PREGNANCY VISIT Patient name: Sheila Jenkins MRN [id number]  Date of birth: June 02, 1991 Chief Complaint:   Routine Prenatal Visit  History of Present Illness:   Quiana Cobaugh is a 27 y.o. [phone] female at [redacted]w[redacted]d with an Estimated Date of Delivery: 09/17/18 being seen today for ongoing management of a low-risk pregnancy.  Today she reports headaches, requests refill on fioricet. No ruq/epigastric pain. Some nausea this am. Some swelling. Pelvic pressure. Contractions: Irregular. Vag. Bleeding: None.  Movement: Present. denies leaking of fluid. Review of Systems:   Pertinent items are noted in HPI Denies abnormal vaginal discharge w/ itching/odor/irritation, headaches, visual changes, shortness of breath, chest pain, abdominal pain, severe nausea/vomiting, or problems with urination or bowel movements unless otherwise stated above. Pertinent History Reviewed:  Reviewed past medical,surgical, social, obstetrical and family history.  Reviewed problem list, medications and allergies. Physical Assessment:   Vitals:   07/23/18 1334  BP: 130/82  Pulse: 87  Weight: 222 lb (100.7 kg)  Body mass index is 38.11 kg/m.        Physical Examination:   General appearance: Well appearing, and in no distress  Mental status: Alert, oriented to person, place, and time  Skin: Warm & dry  Cardiovascular: Normal heart rate noted  Respiratory: Normal respiratory effort, no distress  Abdomen: Soft, gravid, nontender  Pelvic: Cervical exam deferred         Extremities: Edema: Trace, DTRs 2+  Fetal Status: Fetal Heart Rate (bpm): 148 Fundal Height: 32 cm Movement: Present    Results for orders placed or performed in visit on 07/23/18 (from the past 24 hour(s))  POC Urinalysis Dipstick OB   Collection Time: 07/23/18  1:38 PM  Result Value Ref Range   Color, UA     Clarity, UA     Glucose, UA Negative Negative   Bilirubin, UA     Ketones, UA neg    Spec Grav, UA     Blood, UA neg    pH, UA     POC Protein UA Negative Negative, Trace   Urobilinogen, UA     Nitrite, UA neg    Leukocytes, UA Negative Negative   Appearance     Odor      Assessment & Plan:  1) Low-risk pregnancy [id number] at [redacted]w[redacted]d with an Estimated Date of Delivery: 09/17/18   2) H/O GHTN x 2> continue ASA, bp high normal today, will get pre-e labs. Discussed pre-e s/s, reasons to seek care. Take BP QID at home, discussed parameters to let us know about/seek care for  3) Headaches> fioricet helping, refilled today   Meds:  Meds ordered this encounter  Medications  . butalbital-acetaminophen-caffeine (FIORICET, ESGIC) 50-325-40 MG tablet    Sig: Take 1 tablet by mouth every 6 (six) hours as needed for headache.    Dispense:  20 tablet    Refill:  0    Order Specific Question:   Supervising Provider    Answer:   Lazaro Arms [[id number]]   Labs/procedures today: pre-e labs  Plan:  Continue routine obstetrical care   Reviewed: Preterm labor symptoms and general obstetric precautions including but not limited to vaginal bleeding, contractions, leaking of fluid and fetal movement were reviewed in detail with the patient.  All questions were answered  Follow-up: Return in about 1 week (around 07/30/2018) for LROB.  Orders Placed This Encounter  Procedures  . CBC  . Comprehensive metabolic panel  . Protein / creatinine ratio, urine  . POC Urinalysis Dipstick  OB   Cheral Marker CNM, [facility] 07/23/2018 2:19 PM

## 2018-07-24 LAB — CBC
HEMATOCRIT: 33.7 % — AB (ref 34.0–46.6)
Hemoglobin: 11.4 g/dL (ref 11.1–15.9)
MCH: 29.8 pg (ref 26.6–33.0)
MCHC: 33.8 g/dL (ref 31.5–35.7)
MCV: 88 fL (ref 79–97)
Platelets: 267 10*3/uL (ref 150–450)
RBC: 3.82 x10E6/uL (ref 3.77–5.28)
RDW: 12.4 % (ref 12.3–15.4)
WBC: 11.6 10*3/uL — AB (ref 3.4–10.8)

## 2018-07-24 LAB — COMPREHENSIVE METABOLIC PANEL
ALBUMIN: 3.6 g/dL (ref 3.5–5.5)
ALT: 15 IU/L (ref 0–32)
AST: 17 IU/L (ref 0–40)
Albumin/Globulin Ratio: 1.6 (ref 1.2–2.2)
Alkaline Phosphatase: 71 IU/L (ref 39–117)
BUN / CREAT RATIO: 13 (ref 9–23)
BUN: 7 mg/dL (ref 6–20)
Bilirubin Total: <0.2 mg/dL (ref 0.0–1.2)
CALCIUM: 8.6 mg/dL — AB (ref 8.7–10.2)
CO2: 18 mmol/L — AB (ref 20–29)
CREATININE: 0.55 mg/dL — AB (ref 0.57–1.00)
Chloride: 106 mmol/L (ref 96–106)
GFR calc Af Amer: 151 mL/min/{1.73_m2} (ref >59)
GFR, EST NON AFRICAN AMERICAN: 131 mL/min/{1.73_m2} (ref >59)
GLOBULIN, TOTAL: 2.3 g/dL (ref 1.5–4.5)
Glucose: 87 mg/dL (ref 65–99)
Potassium: 4.1 mmol/L (ref 3.5–5.2)
SODIUM: 139 mmol/L (ref 134–144)
Total Protein: 5.9 g/dL — ABNORMAL LOW (ref 6.0–8.5)

## 2018-07-24 LAB — PROTEIN / CREATININE RATIO, URINE
Creatinine, Urine: 146.6 mg/dL
PROTEIN UR: 15.7 mg/dL
Protein/Creat Ratio: 107 mg/g creat (ref 0–200)

## 2018-07-30 ENCOUNTER — Ambulatory Visit (INDEPENDENT_AMBULATORY_CARE_PROVIDER_SITE_OTHER): Payer: Medicaid Other | Admitting: Obstetrics & Gynecology

## 2018-07-30 ENCOUNTER — Encounter: Payer: Self-pay | Admitting: Obstetrics & Gynecology

## 2018-07-30 VITALS — BP 130/84 | HR 102 | Wt 226.4 lb

## 2018-07-30 DIAGNOSIS — Z1389 Encounter for screening for other disorder: Secondary | ICD-10-CM

## 2018-07-30 DIAGNOSIS — Z3483 Encounter for supervision of other normal pregnancy, third trimester: Secondary | ICD-10-CM

## 2018-07-30 DIAGNOSIS — Z331 Pregnant state, incidental: Secondary | ICD-10-CM

## 2018-07-30 DIAGNOSIS — Z3A33 33 weeks gestation of pregnancy: Secondary | ICD-10-CM

## 2018-07-30 LAB — POCT URINALYSIS DIPSTICK OB
GLUCOSE, UA: NEGATIVE
Ketones, UA: NEGATIVE
Leukocytes, UA: NEGATIVE
NITRITE UA: NEGATIVE
RBC UA: NEGATIVE

## 2018-07-30 MED ORDER — PROMETHAZINE HCL 25 MG PO TABS: 25.0000 mg | ORAL_TABLET | Freq: Four times a day (QID) | ORAL | 1 refills | Status: DC | PRN

## 2018-07-30 NOTE — Progress Notes (Signed)
   LOW-RISK PREGNANCY VISIT Patient name: Sheila Jenkins MRN [id number]  Date of birth: 1990-12-15 Chief Complaint:   Routine Prenatal Visit (Fioricet not helping headaches) and think leaking fluid  History of Present Illness:   Sheila Jenkins is a 27 y.o. [phone] female at [redacted]w[redacted]d with an Estimated Date of Delivery: 09/17/18 being seen today for ongoing management of a low-risk pregnancy.  Today she reports her underwear were wet, with discharge, no continuous leak or dripping down the leg. Contractions: Irregular.  .  Movement: Present. reports leaking of fluid. Review of Systems:   Pertinent items are noted in HPI Denies abnormal vaginal discharge w/ itching/odor/irritation, headaches, visual changes, shortness of breath, chest pain, abdominal pain, severe nausea/vomiting, or problems with urination or bowel movements unless otherwise stated above. Pertinent History Reviewed:  Reviewed past medical,surgical, social, obstetrical and family history.  Reviewed problem list, medications and allergies. Physical Assessment:   Vitals:   07/30/18 1555  BP: 130/84  Pulse: (!) 102  Weight: 226 lb 6.4 oz (102.7 kg)  Body mass index is 38.86 kg/m.        Physical Examination:   General appearance: Well appearing, and in no distress  Mental status: Alert, oriented to person, place, and time  Skin: Warm & dry  Cardiovascular: Normal heart rate noted  Respiratory: Normal respiratory effort, no distress  Abdomen: Soft, gravid, nontender  Pelvic: Cervical exam deferred       SSE negatie pooling/negative nitrazine and negative fern  Extremities: Edema: Trace  Fetal Status: Fetal Heart Rate (bpm): 145 Fundal Height: 34 cm Movement: Present    Results for orders placed or performed in visit on 07/30/18 (from the past 24 hour(s))  POC Urinalysis Dipstick OB   Collection Time: 07/30/18  4:01 PM  Result Value Ref Range   Color, UA     Clarity, UA     Glucose, UA Negative Negative   Bilirubin,  UA     Ketones, UA neg    Spec Grav, UA     Blood, UA neg    pH, UA     POC Protein UA Trace Negative, Trace   Urobilinogen, UA     Nitrite, UA neg    Leukocytes, UA Negative Negative   Appearance     Odor      Assessment & Plan:  1) Low-risk pregnancy [id number] at [redacted]w[redacted]d with an Estimated Date of Delivery: 09/17/18   2) Intact BOW, cervical mucous   Meds:  Meds ordered this encounter  Medications  . promethazine (PHENERGAN) 25 MG tablet    Sig: Take 1 tablet (25 mg total) by mouth every 6 (six) hours as needed for nausea or vomiting.    Dispense:  30 tablet    Refill:  1   Labs/procedures today:  Plan:  Continue routine obstetrical care   Reviewed: Preterm labor symptoms and general obstetric precautions including but not limited to vaginal bleeding, contractions, leaking of fluid and fetal movement were reviewed in detail with the patient.  All questions were answered  Follow-up: Return in about 2 weeks (around 08/13/2018) for LROB.  Orders Placed This Encounter  Procedures  . POC Urinalysis Dipstick OB   Amaryllis Dyke Cozetta Seif  07/30/2018 4:18 PM

## 2018-08-14 ENCOUNTER — Ambulatory Visit (INDEPENDENT_AMBULATORY_CARE_PROVIDER_SITE_OTHER): Payer: Medicaid Other | Admitting: Advanced Practice Midwife

## 2018-08-14 ENCOUNTER — Encounter: Payer: Self-pay | Admitting: Advanced Practice Midwife

## 2018-08-14 ENCOUNTER — Other Ambulatory Visit: Payer: Self-pay

## 2018-08-14 VITALS — BP 138/83 | HR 89 | Wt 231.0 lb

## 2018-08-14 DIAGNOSIS — Z1389 Encounter for screening for other disorder: Secondary | ICD-10-CM

## 2018-08-14 DIAGNOSIS — O368130 Decreased fetal movements, third trimester, not applicable or unspecified: Secondary | ICD-10-CM

## 2018-08-14 DIAGNOSIS — Z3A35 35 weeks gestation of pregnancy: Secondary | ICD-10-CM

## 2018-08-14 DIAGNOSIS — Z3483 Encounter for supervision of other normal pregnancy, third trimester: Secondary | ICD-10-CM

## 2018-08-14 DIAGNOSIS — Z331 Pregnant state, incidental: Secondary | ICD-10-CM

## 2018-08-14 LAB — POCT URINALYSIS DIPSTICK OB
Blood, UA: NEGATIVE
Glucose, UA: NEGATIVE
Ketones, UA: NEGATIVE
Leukocytes, UA: NEGATIVE
Nitrite, UA: NEGATIVE
POC,PROTEIN,UA: NEGATIVE

## 2018-08-14 NOTE — Progress Notes (Signed)
  [id number] Estimated Date of Delivery: 09/17/18  Blood pressure 138/83, pulse 89, weight 231 lb (104.8 kg), last menstrual period 11/24/2017.   BP weight and urine results all reviewed and noted.  Please refer to the obstetrical flow sheet for the fundal height and fetal heart rate documentation:  Patient reports good fetal movement, denies any bleeding and no rupture of membranes symptoms or regular contractions. Patient having normal 3rd trimester pregnancy complaints. ALso still having HA, "everywhere" (except occipital).  Fioricet/phenergan/APAP don't help,  Entire abdomen "burns".  Feels like it's inside. Has intermittent purple spots on legs.  None today  All questions were answered.   Physical Assessment:   Vitals:   08/14/18 1421  BP: 138/83  Pulse: 89  Weight: 231 lb (104.8 kg)  Body mass index is 39.65 kg/m.        Physical Examination:   General appearance: Well appearing, and in no distress  Mental status: Alert, oriented to person, place, and time  Skin: Warm & dry  Cardiovascular: Normal heart rate noted  Respiratory: Normal respiratory effort, no distress  Abdomen: Soft, gravid, nontender  Pelvic: Cervical exam deferred         Extremities: Edema: Trace  Fetal Status:     Movement: (!) Decreased   NST: FHR baseline 140 bpm, Variability: moderate, Accelerations:present, Decelerations:  Absent= Cat 1/Reactive  Results for orders placed or performed in visit on 08/14/18 (from the past 24 hour(s))  POC Urinalysis Dipstick OB   Collection Time: 08/14/18  2:22 PM  Result Value Ref Range   Color, UA     Clarity, UA     Glucose, UA Negative Negative   Bilirubin, UA     Ketones, UA neg    Spec Grav, UA     Blood, UA neg    pH, UA     POC,PROTEIN,UA Negative Negative, Trace, Small (1+), Moderate (2+), Large (3+), 4+   Urobilinogen, UA     Nitrite, UA neg    Leukocytes, UA Negative Negative   Appearance     Odor       Orders Placed This Encounter   Procedures  . POC Urinalysis Dipstick OB    Plan:  Continued routine obstetrical care, continue to monitor BP at home. Let us know if >140/90  Return in about 1 week (around 08/21/2018) for LROB.

## 2018-08-21 ENCOUNTER — Encounter: Payer: Self-pay | Admitting: Women's Health

## 2018-08-21 ENCOUNTER — Ambulatory Visit (INDEPENDENT_AMBULATORY_CARE_PROVIDER_SITE_OTHER): Payer: Medicaid Other | Admitting: Women's Health

## 2018-08-21 VITALS — BP 136/82 | HR 102 | Wt 235.0 lb

## 2018-08-21 DIAGNOSIS — Z3483 Encounter for supervision of other normal pregnancy, third trimester: Secondary | ICD-10-CM

## 2018-08-21 DIAGNOSIS — Z1389 Encounter for screening for other disorder: Secondary | ICD-10-CM

## 2018-08-21 DIAGNOSIS — R030 Elevated blood-pressure reading, without diagnosis of hypertension: Secondary | ICD-10-CM

## 2018-08-21 DIAGNOSIS — Z3A36 36 weeks gestation of pregnancy: Secondary | ICD-10-CM

## 2018-08-21 DIAGNOSIS — Z331 Pregnant state, incidental: Secondary | ICD-10-CM

## 2018-08-21 LAB — POCT URINALYSIS DIPSTICK OB
Blood, UA: NEGATIVE
Glucose, UA: NEGATIVE
Ketones, UA: NEGATIVE
NITRITE UA: NEGATIVE
PROTEIN: NEGATIVE

## 2018-08-21 NOTE — Patient Instructions (Addendum)
Judd GaudierMontana Derderian, I greatly value your feedback.  If you receive a survey following your visit with us today, we appreciate you taking the time to fill it out.  Thanks, Joellyn HaffKim Perlie Scheuring, CNM, WHNP-BC  Check your blood pressure 4 times daily and keep a log, bring this with you to all appointments.  If blood pressure is >=160 on top or >=110 on bottom, check again in 5 minutes, if still this high, call us or go to [facility] to be evaluated    Call the office [phone]) or go to [facility] if:  You begin to have strong, frequent contractions  Your water breaks.  Sometimes it is a big gush of fluid, sometimes it is just a trickle that keeps getting your panties wet or running down your legs  You have vaginal bleeding.  It is normal to have a small amount of spotting if your cervix was checked.   You don't feel your baby moving like normal.  If you don't, get you something to eat and drink and lay down and focus on feeling your baby move.  You should feel at least 10 movements in 2 hours.  If you don't, you should call the office or go to [facility].    Call the office [phone]) or go to [facility] for these signs of pre-eclampsia:  Severe headache that does not go away with Tylenol  Visual changes- seeing spots, double, blurred vision  Pain under your right breast or upper abdomen that does not go away with Tums or heartburn medicine  Nausea and/or vomiting  Severe swelling in your hands, feet, and face      Preterm Labor and Birth Information The normal length of a pregnancy is 39-41 weeks. Preterm labor is when labor starts before 37 completed weeks of pregnancy. What are the risk factors for preterm labor? Preterm labor is more likely to occur in women who:  Have certain infections during pregnancy such as a bladder infection, sexually transmitted infection, or infection inside the uterus (chorioamnionitis).  Have a shorter-than-normal cervix.  Have gone  into preterm labor before.  Have had surgery on their cervix.  Are younger than age 25 or older than age 25.  Are African American.  Are pregnant with twins or multiple babies (multiple gestation).  Take street drugs or smoke while pregnant.  Do not gain enough weight while pregnant.  Became pregnant shortly after having been pregnant.  What are the symptoms of preterm labor? Symptoms of preterm labor include:  Cramps similar to those that can happen during a menstrual period. The cramps may happen with diarrhea.  Pain in the abdomen or lower back.  Regular uterine contractions that may feel like tightening of the abdomen.  A feeling of increased pressure in the pelvis.  Increased watery or bloody mucus discharge from the vagina.  Water breaking (ruptured amniotic sac).  Why is it important to recognize signs of preterm labor? It is important to recognize signs of preterm labor because babies who are born prematurely may not be fully developed. This can put them at an increased risk for:  Long-term (chronic) heart and lung problems.  Difficulty immediately after birth with regulating body systems, including blood sugar, body temperature, heart rate, and breathing rate.  Bleeding in the brain.  Cerebral palsy.  Learning difficulties.  Death.  These risks are highest for babies who are born before 34 weeks of pregnancy. How is preterm labor treated? Treatment depends on the length of your  pregnancy, your condition, and the health of your baby. It may involve:  Having a stitch (suture) placed in your cervix to prevent your cervix from opening too early (cerclage).  Taking or being given medicines, such as: ? Hormone medicines. These may be given early in pregnancy to help support the pregnancy. ? Medicine to stop contractions. ? Medicines to help mature the baby's lungs. These may be prescribed if the risk of delivery is high. ? Medicines to prevent your baby from  developing cerebral palsy.  If the labor happens before 34 weeks of pregnancy, you may need to stay in the hospital. What should I do if I think I am in preterm labor? If you think that you are going into preterm labor, call your health care provider right away. How can I prevent preterm labor in future pregnancies? To increase your chance of having a full-term pregnancy:  Do not use any tobacco products, such as cigarettes, chewing tobacco, and e-cigarettes. If you need help quitting, ask your health care provider.  Do not use street drugs or medicines that have not been prescribed to you during your pregnancy.  Talk with your health care provider before taking any herbal supplements, even if you have been taking them regularly.  Make sure you gain a healthy amount of weight during your pregnancy.  Watch for infection. If you think that you might have an infection, get it checked right away.  Make sure to tell your health care provider if you have gone into preterm labor before.  This information is not intended to replace advice given to you by your health care provider. Make sure you discuss any questions you have with your health care provider. Document Released: 12/03/2003 Document Revised: 02/23/2016 Document Reviewed: 02/03/2016 Elsevier Interactive Patient Education  2018 [employer].

## 2018-08-21 NOTE — Progress Notes (Signed)
LOW-RISK PREGNANCY VISIT Patient name: Sheila Jenkins MRN [id number]  Date of birth: [birth year] Chief Complaint:   Routine Prenatal Visit (pulling at belly button; BP elevated last night; no appetite )  History of Present Illness:   Sheila Jenkins is a 27 y.o. [phone] female at [redacted]w[redacted]d with an Estimated Date of Delivery: 09/17/18 being seen today for ongoing management of a low-risk pregnancy.  Today she reports pulling/burning pain at umbilicus, bp 140s/90s last night, but was hurting. Decreased fm since yesterday. No change in her normal headaches, occ spots, no ruq/epigastric pain. Some nausea. Contractions: Irregular. Vag. Bleeding: Scant.  Movement: (!) Decreased. reports leaking of fluid, underwear stay wet Review of Systems:   Pertinent items are noted in HPI Denies abnormal vaginal discharge w/ itching/odor/irritation, headaches, visual changes, shortness of breath, chest pain, abdominal pain, severe nausea/vomiting, or problems with urination or bowel movements unless otherwise stated above. Pertinent History Reviewed:  Reviewed past medical,surgical, social, obstetrical and family history.  Reviewed problem list, medications and allergies. Physical Assessment:   Vitals:   08/21/18 1414  BP: 136/82  Pulse: (!) 102  Weight: 235 lb (106.6 kg)  Body mass index is 40.34 kg/m.        Physical Examination:   General appearance: Well appearing, and in no distress  Mental status: Alert, oriented to person, place, and time  Skin: Warm & dry  Cardiovascular: Normal heart rate noted  Respiratory: Normal respiratory effort, no distress  Abdomen: Soft, gravid, nontender, small umbilical hernia, diastasis recti  Pelvic: spec exam: cx visually closed, no pooling, no change w/ valsalva, fern & nitrazine neg. Mod amt creamy white nonodorous d/c         Extremities: Edema: Trace  Fetal Status: Fetal Heart Rate (bpm): 150 Fundal Height: 37 cm Movement: (!) Decreased    Results for  orders placed or performed in visit on 08/21/18 (from the past 24 hour(s))  POC Urinalysis Dipstick OB   Collection Time: 08/21/18  2:16 PM  Result Value Ref Range   Color, UA     Clarity, UA     Glucose, UA Negative Negative   Bilirubin, UA     Ketones, UA neg    Spec Grav, UA     Blood, UA neg    pH, UA     POC,PROTEIN,UA Negative Negative, Trace, Small (1+), Moderate (2+), Large (3+), 4+   Urobilinogen, UA     Nitrite, UA neg    Leukocytes, UA Trace (A) Negative   Appearance     Odor      Assessment & Plan:  1) Low-risk pregnancy [id number] at [redacted]w[redacted]d with an Estimated Date of Delivery: 09/17/18   2) H/O GHTN x 2, bp high normal here, elevated per pt last night. Will get pre-e labs, discussed pre-e s/s, reasons to seek care. Check bp QID, bring to appt Monday  3) Normal vaginal discharge   Meds: No orders of the defined types were placed in this encounter.  Labs/procedures today: spec exam, pre-e labs  Plan:  Continue routine obstetrical care   Reviewed: Preterm labor symptoms and general obstetric precautions including but not limited to vaginal bleeding, contractions, leaking of fluid and fetal movement were reviewed in detail with the patient.  All questions were answered  Follow-up: Return for Monday for LROB.  Orders Placed This Encounter  Procedures  . CBC  . Comprehensive metabolic panel  . Protein / creatinine ratio, urine  . POC Urinalysis Dipstick OB   Cheral Marker  CNM, WHNP-BC 08/21/2018 3:12 PM

## 2018-08-22 LAB — CBC
Hematocrit: 32 % — ABNORMAL LOW (ref 34.0–46.6)
Hemoglobin: 10.9 g/dL — ABNORMAL LOW (ref 11.1–15.9)
MCH: 28.8 pg (ref 26.6–33.0)
MCHC: 34.1 g/dL (ref 31.5–35.7)
MCV: 85 fL (ref 79–97)
Platelets: 272 10*3/uL (ref 150–450)
RBC: 3.78 x10E6/uL (ref 3.77–5.28)
RDW: 12.8 % (ref 12.3–15.4)
WBC: 12.1 10*3/uL — ABNORMAL HIGH (ref 3.4–10.8)

## 2018-08-22 LAB — COMPREHENSIVE METABOLIC PANEL
A/G RATIO: 1.5 (ref 1.2–2.2)
ALT: 19 IU/L (ref 0–32)
AST: 17 IU/L (ref 0–40)
Albumin: 3.5 g/dL (ref 3.5–5.5)
Alkaline Phosphatase: 95 IU/L (ref 39–117)
BUN/Creatinine Ratio: 19 (ref 9–23)
BUN: 9 mg/dL (ref 6–20)
CHLORIDE: 104 mmol/L (ref 96–106)
CO2: 18 mmol/L — ABNORMAL LOW (ref 20–29)
Calcium: 8.8 mg/dL (ref 8.7–10.2)
Creatinine, Ser: 0.48 mg/dL — ABNORMAL LOW (ref 0.57–1.00)
GFR calc non Af Amer: 137 mL/min/{1.73_m2} (ref >59)
GFR, EST AFRICAN AMERICAN: 158 mL/min/{1.73_m2} (ref >59)
Globulin, Total: 2.3 g/dL (ref 1.5–4.5)
Glucose: 88 mg/dL (ref 65–99)
POTASSIUM: 4.1 mmol/L (ref 3.5–5.2)
Sodium: 137 mmol/L (ref 134–144)
Total Protein: 5.8 g/dL — ABNORMAL LOW (ref 6.0–8.5)

## 2018-08-22 LAB — PROTEIN / CREATININE RATIO, URINE
CREATININE, UR: 192.7 mg/dL
Protein, Ur: 23.5 mg/dL
Protein/Creat Ratio: 122 mg/g creat (ref 0–200)

## 2018-08-27 ENCOUNTER — Encounter: Payer: Self-pay | Admitting: Women's Health

## 2018-08-27 ENCOUNTER — Encounter (HOSPITAL_COMMUNITY): Payer: Self-pay | Admitting: *Deleted

## 2018-08-27 ENCOUNTER — Other Ambulatory Visit: Payer: Self-pay

## 2018-08-27 ENCOUNTER — Inpatient Hospital Stay (HOSPITAL_COMMUNITY)
Admission: AD | Admit: 2018-08-27 | Discharge: 2018-08-30 | DRG: 785 | Disposition: A | Discharge status: Home / Self Care | Payer: Medicaid Other | Attending: Family Medicine | Admitting: Family Medicine

## 2018-08-27 ENCOUNTER — Ambulatory Visit (INDEPENDENT_AMBULATORY_CARE_PROVIDER_SITE_OTHER): Payer: Medicaid Other | Admitting: Women's Health

## 2018-08-27 VITALS — BP 144/85 | HR 81 | Wt 238.0 lb

## 2018-08-27 DIAGNOSIS — Z1389 Encounter for screening for other disorder: Secondary | ICD-10-CM

## 2018-08-27 DIAGNOSIS — O9962 Diseases of the digestive system complicating childbirth: Secondary | ICD-10-CM | POA: Diagnosis present

## 2018-08-27 DIAGNOSIS — O99214 Obesity complicating childbirth: Secondary | ICD-10-CM | POA: Diagnosis present

## 2018-08-27 DIAGNOSIS — Z3483 Encounter for supervision of other normal pregnancy, third trimester: Secondary | ICD-10-CM

## 2018-08-27 DIAGNOSIS — Z331 Pregnant state, incidental: Secondary | ICD-10-CM

## 2018-08-27 DIAGNOSIS — K8020 Calculus of gallbladder without cholecystitis without obstruction: Secondary | ICD-10-CM | POA: Diagnosis present

## 2018-08-27 DIAGNOSIS — O99810 Abnormal glucose complicating pregnancy: Secondary | ICD-10-CM | POA: Diagnosis present

## 2018-08-27 DIAGNOSIS — Z302 Encounter for sterilization: Secondary | ICD-10-CM

## 2018-08-27 DIAGNOSIS — N842 Polyp of vagina: Secondary | ICD-10-CM | POA: Diagnosis present

## 2018-08-27 DIAGNOSIS — O328XX0 Maternal care for other malpresentation of fetus, not applicable or unspecified: Secondary | ICD-10-CM | POA: Diagnosis present

## 2018-08-27 DIAGNOSIS — Z3A37 37 weeks gestation of pregnancy: Secondary | ICD-10-CM | POA: Diagnosis not present

## 2018-08-27 DIAGNOSIS — O9902 Anemia complicating childbirth: Secondary | ICD-10-CM | POA: Diagnosis present

## 2018-08-27 DIAGNOSIS — O3463 Maternal care for abnormality of vagina, third trimester: Secondary | ICD-10-CM | POA: Diagnosis present

## 2018-08-27 DIAGNOSIS — O134 Gestational [pregnancy-induced] hypertension without significant proteinuria, complicating childbirth: Principal | ICD-10-CM | POA: Diagnosis present

## 2018-08-27 DIAGNOSIS — O329XX0 Maternal care for malpresentation of fetus, unspecified, not applicable or unspecified: Secondary | ICD-10-CM | POA: Diagnosis present

## 2018-08-27 DIAGNOSIS — D649 Anemia, unspecified: Secondary | ICD-10-CM | POA: Diagnosis present

## 2018-08-27 DIAGNOSIS — Z8759 Personal history of other complications of pregnancy, childbirth and the puerperium: Secondary | ICD-10-CM

## 2018-08-27 DIAGNOSIS — Z3490 Encounter for supervision of normal pregnancy, unspecified, unspecified trimester: Secondary | ICD-10-CM

## 2018-08-27 HISTORY — DX: Gestational (pregnancy-induced) hypertension without significant proteinuria, unspecified trimester: O13.9

## 2018-08-27 HISTORY — DX: Essential (primary) hypertension: I10

## 2018-08-27 LAB — COMPREHENSIVE METABOLIC PANEL
ALT: 16 U/L (ref 0–44)
AST: 17 U/L (ref 15–41)
Albumin: 3.2 g/dL — ABNORMAL LOW (ref 3.5–5.0)
Alkaline Phosphatase: 80 U/L (ref 38–126)
Anion gap: 7 (ref 5–15)
BUN: 13 mg/dL (ref 6–20)
CALCIUM: 8.7 mg/dL — AB (ref 8.9–10.3)
CO2: 21 mmol/L — ABNORMAL LOW (ref 22–32)
CREATININE: 0.49 mg/dL (ref 0.44–1.00)
Chloride: 108 mmol/L (ref 98–111)
GFR calc Af Amer: >60 mL/min (ref >60)
GFR calc non Af Amer: >60 mL/min (ref >60)
Glucose, Bld: 99 mg/dL (ref 70–99)
Potassium: 4 mmol/L (ref 3.5–5.1)
SODIUM: 136 mmol/L (ref 135–145)
Total Bilirubin: 0.1 mg/dL — ABNORMAL LOW (ref 0.3–1.2)
Total Protein: 6.8 g/dL (ref 6.5–8.1)

## 2018-08-27 LAB — PROTEIN / CREATININE RATIO, URINE
Creatinine, Urine: 54 mg/dL
Protein Creatinine Ratio: 0.13 mg/mg{Cre} (ref 0.00–0.15)
Total Protein, Urine: 7 mg/dL

## 2018-08-27 LAB — CBC WITH DIFFERENTIAL/PLATELET
BASOS ABS: 0 10*3/uL (ref 0.0–0.1)
BASOS PCT: 0 %
Eosinophils Absolute: 0.1 10*3/uL (ref 0.0–0.5)
Eosinophils Relative: 1 %
HEMATOCRIT: 36.2 % (ref 36.0–46.0)
Hemoglobin: 11.5 g/dL — ABNORMAL LOW (ref 12.0–15.0)
LYMPHS PCT: 19 %
Lymphs Abs: 2.2 10*3/uL (ref 0.7–4.0)
MCH: 28.8 pg (ref 26.0–34.0)
MCHC: 31.8 g/dL (ref 30.0–36.0)
MCV: 90.7 fL (ref 80.0–100.0)
MONO ABS: 0.8 10*3/uL (ref 0.1–1.0)
Monocytes Relative: 7 %
NEUTROS ABS: 8.4 10*3/uL — AB (ref 1.7–7.7)
NRBC: 0 % (ref 0.0–0.2)
Neutrophils Relative %: 73 %
Platelets: 225 10*3/uL (ref 150–400)
RBC: 3.99 MIL/uL (ref 3.87–5.11)
RDW: 13.6 % (ref 11.5–15.5)
WBC: 11.6 10*3/uL — ABNORMAL HIGH (ref 4.0–10.5)

## 2018-08-27 LAB — CBC
HCT: 35.2 % — ABNORMAL LOW (ref 36.0–46.0)
Hemoglobin: 11.3 g/dL — ABNORMAL LOW (ref 12.0–15.0)
MCH: 29 pg (ref 26.0–34.0)
MCHC: 32.1 g/dL (ref 30.0–36.0)
MCV: 90.5 fL (ref 80.0–100.0)
Platelets: 211 10*3/uL (ref 150–400)
RBC: 3.89 MIL/uL (ref 3.87–5.11)
RDW: 13.7 % (ref 11.5–15.5)
WBC: 13.2 10*3/uL — ABNORMAL HIGH (ref 4.0–10.5)
nRBC: 0 % (ref 0.0–0.2)

## 2018-08-27 LAB — POCT URINALYSIS DIPSTICK OB
Blood, UA: NEGATIVE
GLUCOSE, UA: NEGATIVE
Ketones, UA: NEGATIVE
LEUKOCYTES UA: NEGATIVE
NITRITE UA: NEGATIVE
PROTEIN: NEGATIVE

## 2018-08-27 LAB — URINALYSIS, ROUTINE W REFLEX MICROSCOPIC
Bilirubin Urine: NEGATIVE
Glucose, UA: NEGATIVE mg/dL
Hgb urine dipstick: NEGATIVE
Ketones, ur: NEGATIVE mg/dL
Nitrite: NEGATIVE
Protein, ur: NEGATIVE mg/dL
SPECIFIC GRAVITY, URINE: 1.013 (ref 1.005–1.030)
pH: 6 (ref 5.0–8.0)

## 2018-08-27 LAB — OB RESULTS CONSOLE GBS: GBS: NEGATIVE

## 2018-08-27 LAB — TYPE AND SCREEN
ABO/RH(D): A POS
Antibody Screen: NEGATIVE

## 2018-08-27 LAB — GROUP B STREP BY PCR: Group B strep by PCR: NEGATIVE

## 2018-08-27 MED ORDER — ONDANSETRON HCL 4 MG/2ML IJ SOLN
4.0000 mg | Freq: Four times a day (QID) | INTRAMUSCULAR | Status: DC | PRN
  Administered 2018-08-28: 4 mg via INTRAVENOUS

## 2018-08-27 MED ORDER — LACTATED RINGERS IV SOLN
INTRAVENOUS | Status: DC
  Administered 2018-08-27 – 2018-08-28 (×3): via INTRAVENOUS

## 2018-08-27 MED ORDER — TERBUTALINE SULFATE 1 MG/ML IJ SOLN
0.2500 mg | Freq: Once | INTRAMUSCULAR | Status: AC | PRN
  Administered 2018-08-28: 0.25 mg via SUBCUTANEOUS
  Filled 2018-08-27: qty 1

## 2018-08-27 MED ORDER — OXYTOCIN 40 UNITS IN LACTATED RINGERS INFUSION - SIMPLE MED: 2.5000 [IU]/h | INTRAVENOUS | Status: DC

## 2018-08-27 MED ORDER — MISOPROSTOL 50MCG HALF TABLET
50.0000 ug | ORAL_TABLET | ORAL | Status: DC
  Administered 2018-08-27 – 2018-08-28 (×2): 50 ug via ORAL
  Filled 2018-08-27 (×3): qty 1

## 2018-08-27 MED ORDER — LACTATED RINGERS IV SOLN: 500.0000 mL | INTRAVENOUS | Status: DC | PRN

## 2018-08-27 MED ORDER — OXYTOCIN BOLUS FROM INFUSION: 500.0000 mL | Freq: Once | INTRAVENOUS | Status: DC

## 2018-08-27 MED ORDER — FENTANYL CITRATE (PF) 100 MCG/2ML IJ SOLN
100.0000 ug | INTRAMUSCULAR | Status: DC | PRN
  Administered 2018-08-28 (×4): 100 ug via INTRAVENOUS
  Filled 2018-08-27 (×4): qty 2

## 2018-08-27 MED ORDER — BUTALBITAL-APAP-CAFFEINE 50-325-40 MG PO TABS
1.0000 | ORAL_TABLET | Freq: Once | ORAL | Status: AC
  Administered 2018-08-27: 1 via ORAL
  Filled 2018-08-27: qty 1

## 2018-08-27 MED ORDER — ACETAMINOPHEN 500 MG PO TABS: 1000.0000 mg | ORAL_TABLET | Freq: Once | ORAL | Status: DC

## 2018-08-27 MED ORDER — OXYCODONE-ACETAMINOPHEN 5-325 MG PO TABS
2.0000 | ORAL_TABLET | Freq: Once | ORAL | Status: AC
  Administered 2018-08-27: 2 via ORAL
  Filled 2018-08-27: qty 2

## 2018-08-27 MED ORDER — ACETAMINOPHEN 325 MG PO TABS: 650.0000 mg | ORAL_TABLET | ORAL | Status: DC | PRN

## 2018-08-27 MED ORDER — SOD CITRATE-CITRIC ACID 500-334 MG/5ML PO SOLN
30.0000 mL | ORAL | Status: DC | PRN
  Administered 2018-08-28: 30 mL via ORAL
  Filled 2018-08-27: qty 15

## 2018-08-27 MED ORDER — LIDOCAINE HCL (PF) 1 % IJ SOLN: 30.0000 mL | INTRAMUSCULAR | Status: DC | PRN

## 2018-08-27 NOTE — MAU Note (Signed)
Sent from office with elevated BP.  Has been going up.  Had pre-e with 1st 2 pregnancies.  Reports headache, seeing spots, increased swelling in hands and feet, pain in RUQ.   Hx of gallstones, so unsure of cause of RUQ pain.

## 2018-08-27 NOTE — H&P (Signed)
LABOR AND DELIVERY ADMISSION HISTORY AND PHYSICAL NOTE  Sheila Jenkins is a 27 y.o. female [phone] with IUP at 8221w0d by 1st trimester 7wk US presenting for concerns of HBP, H/A and dizziness.  Her blood pressures have normalized since admission and she states that her headache and dizziness have improved.  She reports positive fetal movement. She denies leakage of fluid or vaginal bleeding.  [facility]Com[facility] Pregnancy complications:  - HTN  - Gallstones (defered cholecystectomy during pregnancy)   Past Medical History: Past Medical History:  Diagnosis Date  . Gestational hypertension   . Hypertension   . Varicose veins     Past Surgical History: Past Surgical History:  Procedure Laterality Date  . VARICOSE VEIN SURGERY  11/2017  . WISDOM TOOTH EXTRACTION      Obstetrical History: OB History    Gravida  4   Para  2   Term  2   Preterm      AB  1   Living  2     SAB  1   TAB      Ectopic      Multiple      Live Births  2           Social History: Social History   Socioeconomic History  . Marital status: Single    Spouse name: Not on file  . Number of children: Not on file  . Years of education: Not on file  . Highest education level: Not on file  Occupational History  . Not on file  Social Needs  . Financial resource strain: Not on file  . Food insecurity:    Worry: Not on file    Inability: Not on file  . Transportation needs:    Medical: Not on file    Non-medical: Not on file  Tobacco Use  . Smoking status: Never Smoker  . Smokeless tobacco: Never Used  Substance and Sexual Activity  . Alcohol use: No    Alcohol/week: 0.0 standard drinks  . Drug use: No  . Sexual activity: Yes    Birth control/protection: None  Lifestyle  . Physical activity:    Days per week: Not on file    Minutes per session: Not on file  . Stress: Not on file  Relationships  . Social connections:    Talks on phone: Not  on file    Gets together: Not on file    Attends religious service: Not on file    Active member of club or organization: Not on file    Attends meetings of clubs or organizations: Not on file    Relationship status: Not on file  Other Topics Concern  . Not on file  Social History Narrative  . Not on file    Family History: Family History  Problem Relation Age of Onset  . Diabetes Maternal Grandmother   . Diabetes Maternal Grandfather   . Drug abuse Father   . Drug abuse Mother   . Asthma Brother   . Suicidality Brother   . Asthma Son   . Allergies Son   . Allergies Daughter     Allergies: No Known Allergies  Medications Prior to Admission  Medication Sig Dispense Refill Last Dose  . acetaminophen (TYLENOL) 500 MG tablet Take 1,000 mg by mouth as needed.   Taking  . omeprazole (PRILOSEC) 20 MG capsule Take 1 capsule (20 mg total) by mouth daily. 30 capsule 6 Taking  . Pediatric Multiple  Vit-C-FA (FLINSTONES GUMMIES OMEGA-3 DHA PO) Take by mouth. Takes 2 daily   Taking  . promethazine (PHENERGAN) 25 MG tablet Take 1 tablet (25 mg total) by mouth every 6 (six) hours as needed for nausea or vomiting. 30 tablet 1 Taking     Review of Systems  All systems reviewed and negative except as stated in HPI  Physical Exam Blood pressure (!) 117/54, pulse 89, temperature 98.9 F (37.2 C), temperature source Oral, resp. rate 18, height 5\' 4"  (1.626 m), weight 108.1 kg, last menstrual period 11/24/2017. Physical Exam  Constitutional: She is oriented to person, place, and time and well-developed, well-nourished, and in no distress. No distress.  HENT:  Head: Normocephalic and atraumatic.  Eyes: Right eye exhibits no discharge. Left eye exhibits no discharge. No scleral icterus.  Neck: Neck supple. No JVD present. No tracheal deviation present.  Cardiovascular: Normal rate.  Pulmonary/Chest: Effort normal.  Abdominal: Soft.  Neurological: She is alert and oriented to person,  place, and time.  Skin: Skin is warm and dry. She is not diaphoretic.  Psychiatric: Mood and affect normal.  Fetal monitoring: Yes     Prenatal labs: ABO, Rh: A/Positive/-- (05/22 1144) Antibody: Negative (10/02 0844) Rubella: 1.99 (05/22 1144) RPR: Non Reactive (10/02 0844)  HBsAg: Negative (05/22 1144)  HIV: Non Reactive (10/02 0844)  GC/Chlamydia: Initial -/-, 37 weeks pending  GBS:   Unknown 2-hr GTT: 26-28 wks, 77/128/97 Genetic screening:  NT/IT neg Anatomy US: Normal Girl   Prenatal Transfer Tool  Maternal Diabetes: No Genetic Screening: Normal Maternal Ultrasounds/Referrals: Normal Fetal Ultrasounds or other Referrals:  None Maternal Substance Abuse:  No Significant Maternal Medications:  None Significant Maternal Lab Results: None  Results for orders placed or performed during the hospital encounter of 08/27/18 (from the past 24 hour(s))  Protein / creatinine ratio, urine   Collection Time: 08/27/18  4:40 PM  Result Value Ref Range   Creatinine, Urine 54.00 mg/dL   Total Protein, Urine 7 mg/dL   Protein Creatinine Ratio 0.13 0.00 - 0.15 mg/mg[Cre]  CBC with Differential/Platelet   Collection Time: 08/27/18  4:52 PM  Result Value Ref Range   WBC 11.6 (H) 4.0 - 10.5 K/uL   RBC 3.99 3.87 - 5.11 MIL/uL   Hemoglobin 11.5 (L) 12.0 - 15.0 g/dL   HCT 16.1 09.6 - 04.5 %   MCV 90.7 80.0 - 100.0 fL   MCH 28.8 26.0 - 34.0 pg   MCHC 31.8 30.0 - 36.0 g/dL   RDW 40.9 81.1 - 91.4 %   Platelets 225 150 - 400 K/uL   nRBC 0.0 0.0 - 0.2 %   Neutrophils Relative % 73 %   Neutro Abs 8.4 (H) 1.7 - 7.7 K/uL   Lymphocytes Relative 19 %   Lymphs Abs 2.2 0.7 - 4.0 K/uL   Monocytes Relative 7 %   Monocytes Absolute 0.8 0.1 - 1.0 K/uL   Eosinophils Relative 1 %   Eosinophils Absolute 0.1 0.0 - 0.5 K/uL   Basophils Relative 0 %   Basophils Absolute 0.0 0.0 - 0.1 K/uL  Comprehensive metabolic panel   Collection Time: 08/27/18  4:52 PM  Result Value Ref Range   Sodium 136 135 -  145 mmol/L   Potassium 4.0 3.5 - 5.1 mmol/L   Chloride 108 98 - 111 mmol/L   CO2 21 (L) 22 - 32 mmol/L   Glucose, Bld 99 70 - 99 mg/dL   BUN 13 6 - 20 mg/dL   Creatinine, Ser 7.82  0.44 - 1.00 mg/dL   Calcium 8.7 (L) 8.9 - 10.3 mg/dL   Total Protein 6.8 6.5 - 8.1 g/dL   Albumin 3.2 (L) 3.5 - 5.0 g/dL   AST 17 15 - 41 U/L   ALT 16 0 - 44 U/L   Alkaline Phosphatase 80 38 - 126 U/L   Total Bilirubin 0.1 (L) 0.3 - 1.2 mg/dL   GFR calc non Af Amer >60 >60 mL/min   GFR calc Af Amer >60 >60 mL/min   Anion gap 7 5 - 15  Urinalysis, Routine w reflex microscopic   Collection Time: 08/27/18  5:07 PM  Result Value Ref Range   Color, Urine YELLOW YELLOW   APPearance HAZY (A) CLEAR   Specific Gravity, Urine 1.013 1.005 - 1.030   pH 6.0 5.0 - 8.0   Glucose, UA NEGATIVE NEGATIVE mg/dL   Hgb urine dipstick NEGATIVE NEGATIVE   Bilirubin Urine NEGATIVE NEGATIVE   Ketones, ur NEGATIVE NEGATIVE mg/dL   Protein, ur NEGATIVE NEGATIVE mg/dL   Nitrite NEGATIVE NEGATIVE   Leukocytes, UA SMALL (A) NEGATIVE   RBC / HPF 0-5 0 - 5 RBC/hpf   WBC, UA 6-10 0 - 5 WBC/hpf   Bacteria, UA RARE (A) NONE SEEN   Squamous Epithelial / LPF 6-10 0 - 5   Mucus PRESENT   CBC   Collection Time: 08/27/18  8:22 PM  Result Value Ref Range   WBC 13.2 (H) 4.0 - 10.5 K/uL   RBC 3.89 3.87 - 5.11 MIL/uL   Hemoglobin 11.3 (L) 12.0 - 15.0 g/dL   HCT 16.1 (L) 09.6 - 04.5 %   MCV 90.5 80.0 - 100.0 fL   MCH 29.0 26.0 - 34.0 pg   MCHC 32.1 30.0 - 36.0 g/dL   RDW 40.9 81.1 - 91.4 %   Platelets 211 150 - 400 K/uL   nRBC 0.0 0.0 - 0.2 %  Results for orders placed or performed in visit on 08/27/18 (from the past 24 hour(s))  POC Urinalysis Dipstick OB   Collection Time: 08/27/18  2:44 PM  Result Value Ref Range   Color, UA     Clarity, UA     Glucose, UA Negative Negative   Bilirubin, UA     Ketones, UA neg    Spec Grav, UA     Blood, UA neg    pH, UA     POC,PROTEIN,UA Negative Negative, Trace, Small (1+), Moderate  (2+), Large (3+), 4+   Urobilinogen, UA     Nitrite, UA neg    Leukocytes, UA Negative Negative   Appearance     Odor      Patient Active Problem List   Diagnosis Date Noted  . Encounter for induction of labor 08/27/2018  . Gallstones 04/14/2018  . Supervision of normal pregnancy 02/14/2018  . History of gestational hypertension 02/14/2018  . Nausea and vomiting during pregnancy prior to [redacted] weeks gestation 01/24/2018  . History of chlamydia 07/19/2017  . History of miscarriage 07/19/2017  . Abdominal cramps 07/19/2017  . 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 Chronic venous insufficiency 12/18/2015    Assessment: Endiya Klahr is a 27 y.o. [phone] at [redacted]w[redacted]d here for SOL in the setting of HTN and HA. Her blood pressures have improved throughout the day and her HA symptoms have resolved. Her cervix remains at at 0.5cm and she recalls receiving cervidil with prior pregancies.    #Labor: cytotec and will attempt FB #Pain: Minimal at this time  #  FWB:  Tolerating labor well at this time  #ID:  [id number] GBS status, PCN prophylaxis  #MOF: Bottle  #MOC: BTL  Admitted to birthing suite. Unsuccessful attempt at cervical foley placement. Started on cytotec, will re-evaluate in 2 hours.    Salomon Fick, MS3 08/27/2018, 9:17 PM   OB FELLOW HISTORY AND PHYSICAL ATTESTATION  I have seen and examined this patient; I agree with above documentation in the student's note. Given Ms. Soberano's history of Pre-E and current blood pressure elevation coupled with headache that is not responding to oral treatment, we will start induction today.  We will add additional medication for headache, and if unresolved, will consider this a severe feature and start magnesium. Labs are normal so will not start just yet.   Gwenevere Abbot, MD OB Fellow  08/28/2018, 4:18 AM

## 2018-08-27 NOTE — MAU Provider Note (Signed)
History     CSN: [id number]  Arrival date and time: 08/27/18 1622   First Provider Initiated Contact with Patient 08/27/18 1716      Chief Complaint  Patient presents with  . Headache  . Hypertension  . visual changes  . Foot Swelling  . Abdominal Pain   HPI  Sheila Jenkins is a 27 y.o. [phone] at 6370w0d who presents to MAU from [facility] for evaluation of elevated blood pressure. Patient's BP was 144/85 in clinic at 2:45pm today. This is her first elevated BP this pregnancy. History is significant for Gestational Hypertension in both prior pregnancies. Patient also complains of headache 5-8/10. Pain is located in her forehead, does not radiate, no aggravating or alleviating factors. She has not taken medication or tried other treatments. She denies SOB, RUQ pain, and visual disturbances. She also denies vaginal bleeding, leaking of fluid, decreased fetal movement, fever, falls, or recent illness.    Patient was [facility] today and determined to be 0.5 cm dilated  OB History    Gravida  4   Para  2   Term  2   Preterm      AB  1   Living  2     SAB  1   TAB      Ectopic      Multiple      Live Births  2           Past Medical History:  Diagnosis Date  . Gestational hypertension   . Hypertension   . Varicose veins     Past Surgical History:  Procedure Laterality Date  . VARICOSE VEIN SURGERY  11/2017  . WISDOM TOOTH EXTRACTION      Family History  Problem Relation Age of Onset  . Diabetes Maternal Grandmother   . Diabetes Maternal Grandfather   . Drug abuse Father   . Drug abuse Mother   . Asthma Brother   . Suicidality Brother   . Asthma Son   . Allergies Son   . Allergies Daughter     Social History   Tobacco Use  . Smoking status: Never Smoker  . Smokeless tobacco: Never Used  Substance Use Topics  . Alcohol use: No    Alcohol/week: 0.0 standard drinks  . Drug use: No    Allergies: No Known  Allergies  Medications Prior to Admission  Medication Sig Dispense Refill Last Dose  . acetaminophen (TYLENOL) 500 MG tablet Take 1,000 mg by mouth as needed.   Taking  . omeprazole (PRILOSEC) 20 MG capsule Take 1 capsule (20 mg total) by mouth daily. 30 capsule 6 Taking  . Pediatric Multiple Vit-C-FA (FLINSTONES GUMMIES OMEGA-3 DHA PO) Take by mouth. Takes 2 daily   Taking  . promethazine (PHENERGAN) 25 MG tablet Take 1 tablet (25 mg total) by mouth every 6 (six) hours as needed for nausea or vomiting. 30 tablet 1 Taking    Review of Systems  Constitutional: Positive for fatigue. Negative for chills and fever.  Respiratory: Negative for shortness of breath.   Gastrointestinal: Negative for abdominal pain, nausea and vomiting.  Genitourinary: Negative for difficulty urinating, vaginal bleeding, vaginal discharge and vaginal pain.  Musculoskeletal: Negative for back pain.  Neurological: Positive for headaches.  All other systems reviewed and are negative.  Physical Exam   Blood pressure (!) 136/93, pulse 94, temperature 98 F (36.7 C), resp. rate 18, height 5\' 4"  (1.626 m), weight 108.1 kg, last menstrual period 11/24/2017.  Physical Exam  Nursing note and vitals reviewed. Constitutional: She is oriented to person, place, and time. She appears well-developed and well-nourished.  Cardiovascular: Normal rate, normal heart sounds and intact distal pulses.  Respiratory: Effort normal.  GI: Soft. There is no tenderness. There is no CVA tenderness.  Musculoskeletal: Normal range of motion.  Neurological: She is alert and oriented to person, place, and time. She has normal reflexes.  Skin: Skin is warm and dry.  Psychiatric: She has a normal mood and affect. Her behavior is normal. Judgment and thought content normal.    MAU Course/MDM   --Reactive fetal tracing: baseline 140, moderate variability, positive accels, no decels --Toco: irregular contractions, palpate mild --Headache  remains 8/10 55 minutes after Fioricet  Patient Vitals for the past 24 hrs:  BP Temp Pulse Resp Height Weight  08/27/18 1901 (!) 108/92 - 92 - - -  08/27/18 1846 (!) 147/88 - 96 - - -  08/27/18 1831 (!) 156/96 - 94 - - -  08/27/18 1816 132/75 - 86 - - -  08/27/18 1801 135/90 - 86 - - -  08/27/18 1746 (!) 143/94 - 93 - - -  08/27/18 1731 138/88 - 93 - - -  08/27/18 1715 (!) 136/93 - 94 - - -  08/27/18 1711 135/88 98 F (36.7 C) 93 18 5\' 4"  (1.626 m) -  08/27/18 1657 - - - - - 108.1 kg    Results for orders placed or performed during the hospital encounter of 08/27/18 (from the past 24 hour(s))  Protein / creatinine ratio, urine     Status: None   Collection Time: 08/27/18  4:40 PM  Result Value Ref Range   Creatinine, Urine 54.00 mg/dL   Total Protein, Urine 7 mg/dL   Protein Creatinine Ratio 0.13 0.00 - 0.15 mg/mg[Cre]  CBC with Differential/Platelet     Status: Abnormal   Collection Time: 08/27/18  4:52 PM  Result Value Ref Range   WBC 11.6 (H) 4.0 - 10.5 K/uL   RBC 3.99 3.87 - 5.11 MIL/uL   Hemoglobin 11.5 (L) 12.0 - 15.0 g/dL   HCT 16.1 09.6 - 04.5 %   MCV 90.7 80.0 - 100.0 fL   MCH 28.8 26.0 - 34.0 pg   MCHC 31.8 30.0 - 36.0 g/dL   RDW 40.9 81.1 - 91.4 %   Platelets 225 150 - 400 K/uL   nRBC 0.0 0.0 - 0.2 %   Neutrophils Relative % 73 %   Neutro Abs 8.4 (H) 1.7 - 7.7 K/uL   Lymphocytes Relative 19 %   Lymphs Abs 2.2 0.7 - 4.0 K/uL   Monocytes Relative 7 %   Monocytes Absolute 0.8 0.1 - 1.0 K/uL   Eosinophils Relative 1 %   Eosinophils Absolute 0.1 0.0 - 0.5 K/uL   Basophils Relative 0 %   Basophils Absolute 0.0 0.0 - 0.1 K/uL  Comprehensive metabolic panel     Status: Abnormal   Collection Time: 08/27/18  4:52 PM  Result Value Ref Range   Sodium 136 135 - 145 mmol/L   Potassium 4.0 3.5 - 5.1 mmol/L   Chloride 108 98 - 111 mmol/L   CO2 21 (L) 22 - 32 mmol/L   Glucose, Bld 99 70 - 99 mg/dL   BUN 13 6 - 20 mg/dL   Creatinine, Ser 7.82 0.44 - 1.00 mg/dL    Calcium 8.7 (L) 8.9 - 10.3 mg/dL   Total Protein 6.8 6.5 - 8.1 g/dL   Albumin 3.2 (L)  3.5 - 5.0 g/dL   AST 17 15 - 41 U/L   ALT 16 0 - 44 U/L   Alkaline Phosphatase 80 38 - 126 U/L   Total Bilirubin 0.1 (L) 0.3 - 1.2 mg/dL   GFR calc non Af Amer >60 >60 mL/min   GFR calc Af Amer >60 >60 mL/min   Anion gap 7 5 - 15  Urinalysis, Routine w reflex microscopic     Status: Abnormal   Collection Time: 08/27/18  5:07 PM  Result Value Ref Range   Color, Urine YELLOW YELLOW   APPearance HAZY (A) CLEAR   Specific Gravity, Urine 1.013 1.005 - 1.030   pH 6.0 5.0 - 8.0   Glucose, UA NEGATIVE NEGATIVE mg/dL   Hgb urine dipstick NEGATIVE NEGATIVE   Bilirubin Urine NEGATIVE NEGATIVE   Ketones, ur NEGATIVE NEGATIVE mg/dL   Protein, ur NEGATIVE NEGATIVE mg/dL   Nitrite NEGATIVE NEGATIVE   Leukocytes, UA SMALL (A) NEGATIVE   RBC / HPF 0-5 0 - 5 RBC/hpf   WBC, UA 6-10 0 - 5 WBC/hpf   Bacteria, UA RARE (A) NONE SEEN   Squamous Epithelial / LPF 6-10 0 - 5   Mucus PRESENT     Meds ordered this encounter  Medications  . butalbital-acetaminophen-caffeine (FIORICET, ESGIC) 50-325-40 MG per tablet 1 tablet  . oxyCODONE-acetaminophen (PERCOCET/ROXICET) 5-325 MG per tablet 2 tablet    Assessment and Plan  --27 y.o. [id number] at [redacted]w[redacted]d, 0.5cm in clinic today --Reactive fetal tracing --Gestational Hypertension, negative Preeclampsia labs --Headache not responsive to Fioricet given in MAU --Admit to [facility], initiate Magnesium Sulfate for headache not responsive to Percocet --Labor team aware, report given.  Calvert Cantor, CNM 08/27/2018, 7:56 PM

## 2018-08-27 NOTE — Progress Notes (Signed)
   LOW-RISK PREGNANCY VISIT Patient name: Sheila Jenkins MRN [id number]  Date of birth: 1991-01-20 Chief Complaint:   Routine Prenatal Visit (gbs/gc)  History of Present Illness:   Sheila Jenkins is a 27 y.o. [phone] [id number] female at 2141w0d with an Estimated Date of Delivery: 09/17/18 being seen today for ongoing management of a low-risk pregnancy.  Today she reports headache- hasn't taken apap, some RUQ pain, some n/v. BPs at home labile. 146/86 last night. H/O GHTN x 2.  Contractions: Irregular. Vag. Bleeding: None.  Movement: Present. denies leaking of fluid. Review of Systems:   Pertinent items are noted in HPI Denies abnormal vaginal discharge w/ itching/odor/irritation, headaches, visual changes, shortness of breath, chest pain, abdominal pain, severe nausea/vomiting, or problems with urination or bowel movements unless otherwise stated above. Pertinent History Reviewed:  Reviewed past medical,surgical, social, obstetrical and family history.  Reviewed problem list, medications and allergies. Physical Assessment:   Vitals:   08/27/18 1444  BP: (!) 144/85  Pulse: 81  Weight: 238 lb (108 kg)  Body mass index is 40.85 kg/m.        Physical Examination:   General appearance: Well appearing, and in no distress  Mental status: Alert, oriented to person, place, and time  Skin: Warm & dry  Cardiovascular: Normal heart rate noted  Respiratory: Normal respiratory effort, no distress  Abdomen: Soft, gravid, nontender  Pelvic: Cervical exam performed, fleshy pedunculated growth at introitus- pt states has been there since delivery 166yrs ago, bothers her. Co-exam w/ LHE, likely hymenal remnant/tore w/ delivery- ok to excise at delivery Dilation: Fingertip Effacement (%): Thick Station: Ballotable  Extremities: Edema: Trace  Fetal Status: Fetal Heart Rate (bpm): 152 Fundal Height: 38 cm Movement: Present Presentation: Vertex  Results for orders placed or performed in visit on 08/27/18 (from the  past 24 hour(s))  POC Urinalysis Dipstick OB   Collection Time: 08/27/18  2:44 PM  Result Value Ref Range   Color, UA     Clarity, UA     Glucose, UA Negative Negative   Bilirubin, UA     Ketones, UA neg    Spec Grav, UA     Blood, UA neg    pH, UA     POC,PROTEIN,UA Negative Negative, Trace, Small (1+), Moderate (2+), Large (3+), 4+   Urobilinogen, UA     Nitrite, UA neg    Leukocytes, UA Negative Negative   Appearance     Odor      Assessment & Plan:  1) Low-risk pregnancy [id number] at 3941w0d with an Estimated Date of Delivery: 09/17/18   2) Elevated bp, h/o GHTN x 2, no proteinuria, +HA, some ruq pain, nausea. Will send to mau for further eval. Notified Thalia BloodgoodSam Weinhold, CNM   Meds: No orders of the defined types were placed in this encounter.  Labs/procedures today: gbs, gc/ct, sve  Plan:  Continue routine obstetrical care   Reviewed: Term labor symptoms and general obstetric precautions including but not limited to vaginal bleeding, contractions, leaking of fluid and fetal movement were reviewed in detail with the patient.  All questions were answered  Follow-up: Return for Thurs for LROB. if d/cd from MAU today  Orders Placed This Encounter  Procedures  . GC/Chlamydia Probe Amp  . Culture, beta strep (group b only)  . POC Urinalysis Dipstick OB   Cheral MarkerKimberly R Booker CNM, [facility]NP-BC 08/27/2018 3:21 PM

## 2018-08-27 NOTE — Anesthesia Pain Management Evaluation Note (Signed)
  CRNA Pain Management Visit Note  Patient: Sheila Jenkins, 27 y.o., female  "Hello I am a [facility] of the anesthesia [facility]. We have an anesthesia team available at all times to provide care throughout the hospital, including epidural management and anesthesia for C-section. I don't know your plan for the delivery whether it a natural birth, water birth, IV sedation, nitrous supplementation, doula or epidural, but we want to meet your pain goals."   1.Was your pain managed to your expectations on prior hospitalizations?   No, last time got epidural too close to delivery and it didn't work well.   2.What is your expectation for pain management during this hospitalization?     Epidural and IV pain meds. Recommended to patient to get epidural earlier in labor than last time.  3.How can we help you reach that goal? Pain control when desired  Record the patient's initial score and the patient's pain goal.   Pain: 3  Pain Goal: 5 [facility] wants you to be able to say your pain was always managed very well.  Ting Cage 08/27/2018

## 2018-08-27 NOTE — Patient Instructions (Signed)
Judd GaudierMontana Seefeldt, I greatly value your feedback.  If you receive a survey following your visit with us today, we appreciate you taking the time to fill it out.  Thanks, Joellyn HaffKim Booker, CNM, WHNP-BC   Call the office [phone]) or go to [facility] if:  You begin to have strong, frequent contractions  Your water breaks.  Sometimes it is a big gush of fluid, sometimes it is just a trickle that keeps getting your panties wet or running down your legs  You have vaginal bleeding.  It is normal to have a small amount of spotting if your cervix was checked.   You don't feel your baby moving like normal.  If you don't, get you something to eat and drink and lay down and focus on feeling your baby move.  You should feel at least 10 movements in 2 hours.  If you don't, you should call the office or go to [facility].    Call the office [phone]) or go to [facility] for these signs of pre-eclampsia:  Severe headache that does not go away with Tylenol  Visual changes- seeing spots, double, blurred vision  Pain under your right breast or upper abdomen that does not go away with Tums or heartburn medicine  Nausea and/or vomiting  Severe swelling in your hands, feet, and face

## 2018-08-28 ENCOUNTER — Inpatient Hospital Stay (HOSPITAL_COMMUNITY): Payer: Medicaid Other | Admitting: Anesthesiology

## 2018-08-28 ENCOUNTER — Encounter (HOSPITAL_COMMUNITY)
Admission: AD | Disposition: A | Discharge status: Home / Self Care | Payer: Self-pay | Source: Home / Self Care | Attending: Family Medicine

## 2018-08-28 ENCOUNTER — Encounter (HOSPITAL_COMMUNITY): Payer: Self-pay | Admitting: Anesthesiology

## 2018-08-28 DIAGNOSIS — Z302 Encounter for sterilization: Secondary | ICD-10-CM

## 2018-08-28 DIAGNOSIS — Z3A37 37 weeks gestation of pregnancy: Secondary | ICD-10-CM

## 2018-08-28 DIAGNOSIS — O328XX0 Maternal care for other malpresentation of fetus, not applicable or unspecified: Secondary | ICD-10-CM

## 2018-08-28 LAB — RPR: RPR Ser Ql: NONREACTIVE

## 2018-08-28 LAB — ABO/RH: ABO/RH(D): A POS

## 2018-08-28 SURGERY — Surgical Case
Anesthesia: Spinal | Site: Abdomen | Wound class: Clean Contaminated

## 2018-08-28 MED ORDER — DIPHENHYDRAMINE HCL 50 MG/ML IJ SOLN: 12.5000 mg | INTRAMUSCULAR | Status: DC | PRN

## 2018-08-28 MED ORDER — NALBUPHINE HCL 10 MG/ML IJ SOLN: 5.0000 mg | Freq: Once | INTRAMUSCULAR | Status: DC | PRN

## 2018-08-28 MED ORDER — SODIUM CHLORIDE 0.9 % IR SOLN
Status: DC | PRN
  Administered 2018-08-28: 1000 mL

## 2018-08-28 MED ORDER — DIBUCAINE 1 % RE OINT: 1.0000 "application " | TOPICAL_OINTMENT | RECTAL | Status: DC | PRN

## 2018-08-28 MED ORDER — OXYCODONE-ACETAMINOPHEN 5-325 MG PO TABS: 2.0000 | ORAL_TABLET | ORAL | Status: DC | PRN

## 2018-08-28 MED ORDER — DEXAMETHASONE SODIUM PHOSPHATE 4 MG/ML IJ SOLN
INTRAMUSCULAR | Status: DC | PRN
  Administered 2018-08-28: 4 mg via INTRAVENOUS

## 2018-08-28 MED ORDER — MEPERIDINE HCL 25 MG/ML IJ SOLN: 6.2500 mg | INTRAMUSCULAR | Status: DC | PRN

## 2018-08-28 MED ORDER — SCOPOLAMINE 1 MG/3DAYS TD PT72: 1.0000 | MEDICATED_PATCH | Freq: Once | TRANSDERMAL | Status: DC

## 2018-08-28 MED ORDER — ENOXAPARIN SODIUM 60 MG/0.6ML ~~LOC~~ SOLN
50.0000 mg | SUBCUTANEOUS | Status: DC
  Administered 2018-08-29 – 2018-08-30 (×2): 50 mg via SUBCUTANEOUS
  Filled 2018-08-28 (×2): qty 0.6

## 2018-08-28 MED ORDER — DIPHENHYDRAMINE HCL 25 MG PO CAPS
25.0000 mg | ORAL_CAPSULE | ORAL | Status: DC | PRN
  Filled 2018-08-28: qty 1

## 2018-08-28 MED ORDER — ONDANSETRON HCL 4 MG/2ML IJ SOLN: 4.0000 mg | Freq: Three times a day (TID) | INTRAMUSCULAR | Status: DC | PRN

## 2018-08-28 MED ORDER — NALBUPHINE HCL 10 MG/ML IJ SOLN: 5.0000 mg | INTRAMUSCULAR | Status: DC | PRN

## 2018-08-28 MED ORDER — BUPIVACAINE IN DEXTROSE 0.75-8.25 % IT SOLN
INTRATHECAL | Status: DC | PRN
  Administered 2018-08-28: 1.4 mL via INTRATHECAL

## 2018-08-28 MED ORDER — PHENYLEPHRINE 8 MG IN D5W 100 ML (0.08MG/ML) PREMIX OPTIME
INJECTION | INTRAVENOUS | Status: DC | PRN
  Administered 2018-08-28: 60 ug/min via INTRAVENOUS

## 2018-08-28 MED ORDER — OXYTOCIN 10 UNIT/ML IJ SOLN
INTRAMUSCULAR | Status: AC
  Filled 2018-08-28: qty 4

## 2018-08-28 MED ORDER — OXYTOCIN 40 UNITS IN LACTATED RINGERS INFUSION - SIMPLE MED: 2.5000 [IU]/h | INTRAVENOUS | Status: AC

## 2018-08-28 MED ORDER — KETOROLAC TROMETHAMINE 30 MG/ML IJ SOLN: 30.0000 mg | Freq: Once | INTRAMUSCULAR | Status: DC | PRN

## 2018-08-28 MED ORDER — WITCH HAZEL-GLYCERIN EX PADS: 1.0000 "application " | MEDICATED_PAD | CUTANEOUS | Status: DC | PRN

## 2018-08-28 MED ORDER — SIMETHICONE 80 MG PO CHEW
80.0000 mg | CHEWABLE_TABLET | ORAL | Status: DC
  Administered 2018-08-28 – 2018-08-30 (×2): 80 mg via ORAL
  Filled 2018-08-28 (×2): qty 1

## 2018-08-28 MED ORDER — MENTHOL 3 MG MT LOZG: 1.0000 | LOZENGE | OROMUCOSAL | Status: DC | PRN

## 2018-08-28 MED ORDER — PHENYLEPHRINE 40 MCG/ML (10ML) SYRINGE FOR IV PUSH (FOR BLOOD PRESSURE SUPPORT)
PREFILLED_SYRINGE | INTRAVENOUS | Status: AC
  Filled 2018-08-28: qty 10

## 2018-08-28 MED ORDER — KETOROLAC TROMETHAMINE 30 MG/ML IJ SOLN
INTRAMUSCULAR | Status: AC
  Filled 2018-08-28: qty 1

## 2018-08-28 MED ORDER — COCONUT OIL OIL: 1.0000 "application " | TOPICAL_OIL | Status: DC | PRN

## 2018-08-28 MED ORDER — LACTATED RINGERS IV SOLN
INTRAVENOUS | Status: DC
  Administered 2018-08-28: 23:00:00 via INTRAVENOUS

## 2018-08-28 MED ORDER — DEXAMETHASONE SODIUM PHOSPHATE 10 MG/ML IJ SOLN
INTRAMUSCULAR | Status: AC
  Filled 2018-08-28: qty 1

## 2018-08-28 MED ORDER — SIMETHICONE 80 MG PO CHEW: 80.0000 mg | CHEWABLE_TABLET | ORAL | Status: DC | PRN

## 2018-08-28 MED ORDER — KETOROLAC TROMETHAMINE 30 MG/ML IJ SOLN
30.0000 mg | Freq: Four times a day (QID) | INTRAMUSCULAR | Status: AC | PRN
  Administered 2018-08-28: 30 mg via INTRAVENOUS

## 2018-08-28 MED ORDER — TETANUS-DIPHTH-ACELL PERTUSSIS 5-2.5-18.5 LF-MCG/0.5 IM SUSP: 0.5000 mL | Freq: Once | INTRAMUSCULAR | Status: DC

## 2018-08-28 MED ORDER — SENNOSIDES-DOCUSATE SODIUM 8.6-50 MG PO TABS
2.0000 | ORAL_TABLET | ORAL | Status: DC
  Administered 2018-08-28 – 2018-08-30 (×2): 2 via ORAL
  Filled 2018-08-28 (×2): qty 2

## 2018-08-28 MED ORDER — NALBUPHINE HCL 10 MG/ML IJ SOLN
INTRAMUSCULAR | Status: AC
  Administered 2018-08-28: 5 mg
  Filled 2018-08-28: qty 1

## 2018-08-28 MED ORDER — ACETAMINOPHEN 325 MG PO TABS: 650.0000 mg | ORAL_TABLET | ORAL | Status: DC | PRN

## 2018-08-28 MED ORDER — CEFAZOLIN SODIUM-DEXTROSE 2-4 GM/100ML-% IV SOLN
2.0000 g | Freq: Once | INTRAVENOUS | Status: AC
  Administered 2018-08-28: 2 g via INTRAVENOUS

## 2018-08-28 MED ORDER — NALOXONE HCL 4 MG/10ML IJ SOLN: 1.0000 ug/kg/h | INTRAVENOUS | Status: DC | PRN

## 2018-08-28 MED ORDER — IBUPROFEN 600 MG PO TABS
600.0000 mg | ORAL_TABLET | Freq: Four times a day (QID) | ORAL | Status: DC
  Administered 2018-08-28 – 2018-08-30 (×7): 600 mg via ORAL
  Filled 2018-08-28 (×7): qty 1

## 2018-08-28 MED ORDER — OXYCODONE-ACETAMINOPHEN 5-325 MG PO TABS
1.0000 | ORAL_TABLET | ORAL | Status: DC | PRN
  Administered 2018-08-29 – 2018-08-30 (×3): 1 via ORAL
  Filled 2018-08-28 (×4): qty 1

## 2018-08-28 MED ORDER — PROMETHAZINE HCL 25 MG/ML IJ SOLN: 6.2500 mg | INTRAMUSCULAR | Status: DC | PRN

## 2018-08-28 MED ORDER — SODIUM CHLORIDE 0.9% FLUSH: 3.0000 mL | INTRAVENOUS | Status: DC | PRN

## 2018-08-28 MED ORDER — ONDANSETRON HCL 4 MG/2ML IJ SOLN
INTRAMUSCULAR | Status: AC
  Filled 2018-08-28: qty 2

## 2018-08-28 MED ORDER — ZOLPIDEM TARTRATE 5 MG PO TABS: 5.0000 mg | ORAL_TABLET | Freq: Every evening | ORAL | Status: DC | PRN

## 2018-08-28 MED ORDER — MORPHINE SULFATE (PF) 0.5 MG/ML IJ SOLN
INTRAMUSCULAR | Status: AC
  Filled 2018-08-28: qty 10

## 2018-08-28 MED ORDER — KETOROLAC TROMETHAMINE 30 MG/ML IJ SOLN: 30.0000 mg | Freq: Four times a day (QID) | INTRAMUSCULAR | Status: AC | PRN

## 2018-08-28 MED ORDER — MORPHINE SULFATE (PF) 0.5 MG/ML IJ SOLN
INTRAMUSCULAR | Status: DC | PRN
  Administered 2018-08-28: .15 mg via INTRATHECAL

## 2018-08-28 MED ORDER — FENTANYL CITRATE (PF) 100 MCG/2ML IJ SOLN
INTRAMUSCULAR | Status: DC | PRN
  Administered 2018-08-28: 15 ug via INTRATHECAL

## 2018-08-28 MED ORDER — PRENATAL MULTIVITAMIN CH
1.0000 | ORAL_TABLET | Freq: Every day | ORAL | Status: DC
  Administered 2018-08-29 – 2018-08-30 (×2): 1 via ORAL
  Filled 2018-08-28 (×2): qty 1

## 2018-08-28 MED ORDER — HYDROMORPHONE HCL 1 MG/ML IJ SOLN: 0.2500 mg | INTRAMUSCULAR | Status: DC | PRN

## 2018-08-28 MED ORDER — STERILE WATER FOR IRRIGATION IR SOLN
Status: DC | PRN
  Administered 2018-08-28: 1000 mL

## 2018-08-28 MED ORDER — LACTATED RINGERS IV SOLN
INTRAVENOUS | Status: DC | PRN
  Administered 2018-08-28: 14:00:00 via INTRAVENOUS

## 2018-08-28 MED ORDER — FENTANYL CITRATE (PF) 100 MCG/2ML IJ SOLN
INTRAMUSCULAR | Status: AC
  Filled 2018-08-28: qty 2

## 2018-08-28 MED ORDER — DIPHENHYDRAMINE HCL 25 MG PO CAPS
25.0000 mg | ORAL_CAPSULE | Freq: Four times a day (QID) | ORAL | Status: DC | PRN
  Administered 2018-08-28: 25 mg via ORAL

## 2018-08-28 MED ORDER — ACETAMINOPHEN 500 MG PO TABS
1000.0000 mg | ORAL_TABLET | Freq: Four times a day (QID) | ORAL | Status: AC
  Administered 2018-08-28 – 2018-08-29 (×3): 1000 mg via ORAL
  Filled 2018-08-28 (×3): qty 2

## 2018-08-28 MED ORDER — PHENYLEPHRINE HCL 10 MG/ML IJ SOLN
INTRAMUSCULAR | Status: DC | PRN
  Administered 2018-08-28 (×2): 80 ug via INTRAVENOUS

## 2018-08-28 MED ORDER — OXYTOCIN 10 UNIT/ML IJ SOLN
INTRAVENOUS | Status: DC | PRN
  Administered 2018-08-28: 40 [IU] via INTRAVENOUS

## 2018-08-28 MED ORDER — NALOXONE HCL 0.4 MG/ML IJ SOLN: 0.4000 mg | INTRAMUSCULAR | Status: DC | PRN

## 2018-08-28 MED ORDER — SIMETHICONE 80 MG PO CHEW
80.0000 mg | CHEWABLE_TABLET | Freq: Three times a day (TID) | ORAL | Status: DC
  Administered 2018-08-28 – 2018-08-30 (×5): 80 mg via ORAL
  Filled 2018-08-28 (×5): qty 1

## 2018-08-28 SURGICAL SUPPLY — 30 items
BENZOIN TINCTURE PRP APPL 2/3 (GAUZE/BANDAGES/DRESSINGS) ×2 IMPLANT
CHLORAPREP W/TINT 26ML (MISCELLANEOUS) ×2 IMPLANT
CLAMP CORD UMBIL (MISCELLANEOUS) ×2 IMPLANT
CLIP FILSHIE TUBAL LIGA STRL (Clip) ×2 IMPLANT
CLOTH BEACON ORANGE TIMEOUT ST (SAFETY) ×2 IMPLANT
DRSG OPSITE POSTOP 4X10 (GAUZE/BANDAGES/DRESSINGS) ×2 IMPLANT
ELECT REM PT RETURN 9FT ADLT (ELECTROSURGICAL) ×2
ELECTRODE REM PT RTRN 9FT ADLT (ELECTROSURGICAL) ×1 IMPLANT
GLOVE BIOGEL PI IND STRL 7.0 (GLOVE) ×2 IMPLANT
GLOVE BIOGEL PI IND STRL 7.5 (GLOVE) ×2 IMPLANT
GLOVE BIOGEL PI INDICATOR 7.0 (GLOVE) ×2
GLOVE BIOGEL PI INDICATOR 7.5 (GLOVE) ×2
GLOVE ECLIPSE 7.5 STRL STRAW (GLOVE) ×2 IMPLANT
GOWN STRL REUS W/TWL LRG LVL3 (GOWN DISPOSABLE) ×6 IMPLANT
NS IRRIG 1000ML POUR BTL (IV SOLUTION) ×2 IMPLANT
PACK C SECTION WH (CUSTOM PROCEDURE TRAY) ×2 IMPLANT
PAD OB MATERNITY 4.3X12.25 (PERSONAL CARE ITEMS) ×2 IMPLANT
PENCIL SMOKE EVAC W/HOLSTER (ELECTROSURGICAL) ×2 IMPLANT
RTRCTR C-SECT PINK 25CM LRG (MISCELLANEOUS) ×2 IMPLANT
SPONGE LAP 18X18 RF (DISPOSABLE) ×6 IMPLANT
STRIP CLOSURE SKIN 1/2X4 (GAUZE/BANDAGES/DRESSINGS) ×2 IMPLANT
SUT VIC AB 0 CTX 36 (SUTURE) ×3
SUT VIC AB 0 CTX36XBRD ANBCTRL (SUTURE) ×3 IMPLANT
SUT VIC AB 2-0 CT1 27 (SUTURE) ×1
SUT VIC AB 2-0 CT1 TAPERPNT 27 (SUTURE) ×1 IMPLANT
SUT VIC AB 3-0 SH 27 (SUTURE) ×1
SUT VIC AB 3-0 SH 27X BRD (SUTURE) ×1 IMPLANT
SUT VIC AB 4-0 KS 27 (SUTURE) ×2 IMPLANT
TOWEL OR 17X24 6PK STRL BLUE (TOWEL DISPOSABLE) ×2 IMPLANT
TRAY FOLEY W/BAG SLVR 14FR LF (SET/KITS/TRAYS/PACK) ×2 IMPLANT

## 2018-08-28 NOTE — Op Note (Signed)
[facility] PROCEDURE DATE: 08/28/2018  PREOPERATIVE DIAGNOSIS: Intrauterine pregnancy at  9559w1d weeks gestation; malpresentation: breech  POSTOPERATIVE DIAGNOSIS: The same  PROCEDURE: Primary Low Transverse Cesarean Section with tubal ligation  SURGEON:  Dr. Candelaria CelesteJacob Lakeidra Reliford  ASSISTANT: none  INDICATIONS: Sheila Jenkins is a 27 y.o. [id number] at 5959w1d scheduled for cesarean section secondary to malpresentation: breech.  The risks of cesarean section discussed with the patient included but were not limited to: bleeding which may require transfusion or reoperation; infection which may require antibiotics; injury to bowel, bladder, ureters or other surrounding organs; injury to the fetus; need for additional procedures including hysterectomy in the event of a life-threatening hemorrhage; placental abnormalities wth subsequent pregnancies, incisional problems, thromboembolic phenomenon and other postoperative/anesthesia complications. The patient concurred with the proposed plan, giving informed written consent for the procedure.    FINDINGS:  Viable female infant in footling breech presentation.  Apgars 9 and 9, weight pending.  Clear amniotic fluid.  Intact placenta, three vessel cord.  Normal uterus, fallopian tubes and ovaries bilaterally.  ANESTHESIA:    Spinal INTRAVENOUS FLUIDS: 1300 ml QUANTITATIVE BLOOD LOSS: 336 ml Estimated Blood Loss: 550mL visualized URINE OUTPUT:  75 ml SPECIMENS: Placenta sent to L&D COMPLICATIONS: None immediate  PROCEDURE IN DETAIL:  The patient received intravenous antibiotics and had sequential compression devices applied to her lower extremities while in the preoperative area.  She was then taken to the operating room where spinal anesthesia was administered and was found to be adequate. She was then placed in a dorsal supine position with a leftward tilt, and prepped and draped in a sterile manner.  A foley catheter was placed into her bladder and attached  to constant gravity, which drained clear fluid throughout.  After an adequate timeout was performed, a Pfannenstiel skin incision was made with scalpel and carried through to the underlying layer of fascia. The fascia was incised in the midline and this incision was extended bilaterally using the Mayo scissors. Kocher clamps were applied to the superior aspect of the fascial incision and the underlying rectus muscles were dissected off bluntly. The rectus muscles were separated in the midline bluntly and the peritoneum was entered bluntly. An Alexis retractor was placed to aid in visualization of the uterus.  Attention was turned to the lower uterine segment where a transverse hysterotomy was made with a scalpel and extended bilaterally bluntly. The infant was successfully delivered, and cord was clamped and cut and infant was handed over to awaiting neonatology team. Uterine massage was then administered and the placenta delivered intact with three-vessel cord. The uterus was then cleared of clot and debris.  The hysterotomy was closed with 0 Vicryl in a running locked fashion, and an imbricating layer was also placed with a 0 Vicryl. Overall, excellent hemostasis was noted.    Attention was then turned to the fallopian tubes.  A Filshie clip was placed on both tubes, about 2 cm from the cornua, with care given to incorporate the underlying mesosalpinx on both sides, allowing for bilateral tubal sterilization.  The abdomen and the pelvis were cleared of all clot and debris and the Jon Gillslexis was removed. Hemostasis was confirmed on all surfaces.  The peritoneum and rectus were reapproximated using 2-0 vicryl running stitches. The fascia was then closed using 0 Vicryl in a running fashion. The subcutaneous layer was reapproximated with plain gut and the skin was closed with 4-0 vicryl. The patient tolerated the procedure well. Sponge, lap, instrument and needle counts were correct x  2.   Attention was turned to  the vaginal. A 1cm fleshy vaginal polyp at the introitus was identified. The was excised at its staulk with Mayo scissors. The base was cauterized with silver nitrate to achieve hemostasis.  She was taken to the recovery room in stable condition.     Levie Heritage, DO 08/28/2018 2:27 PM

## 2018-08-28 NOTE — Progress Notes (Signed)
Patient ID: Sheila Jenkins, female   DOB: 08-24-1991, 27 y.o.   MRN: [id number]  Patient seen. US done - baby oblique, head maternal right. Discussed options with her and FOB. After informed verbal consent, Terbutaline 0.25 mg SQ given, ECV was attempted under Ultrasound guidance.  Attempted both clockwise and counterclockwise roll, but baby returned to oblique position. Not amenable to breech vaginal delivery.   FHR was reactive before and after the procedure.   Pt. Tolerated the procedure well.  Will proceed with c/s.   The risks of cesarean section discussed with the patient included but were not limited to: bleeding which may require transfusion or reoperation; infection which may require antibiotics; injury to bowel, bladder, ureters or other surrounding organs; injury to the fetus; need for additional procedures including hysterectomy in the event of a life-threatening hemorrhage; placental abnormalities wth subsequent pregnancies, incisional problems, thromboembolic phenomenon and other postoperative/anesthesia complications. The patient concurred with the proposed plan, giving informed written consent for the procedure.   Patient has been NPO since last night, she will remain NPO for procedure. Anesthesia and OR aware.  Preoperative prophylactic Ancef ordered on call to the OR.  To OR when ready.  Levie HeritageStinson, Lenay Lovejoy J, DO 08/28/2018 12:48 PM    Levie HeritageStinson, Shakala Marlatt J, DO 08/28/2018, 12:47 PM

## 2018-08-28 NOTE — Transfer of Care (Signed)
Immediate Anesthesia Transfer of Care Note  Patient: Sheila Jenkins  Procedure(s) Performed: CESAREAN SECTION WITH BILATERAL TUBAL LIGATION (N/A Abdomen)  Patient Location: PACU  Anesthesia Type:Spinal  Level of Consciousness: awake  Airway & Oxygen Therapy: Patient Spontanous Breathing  Post-op Assessment: Report given to RN and Post -op Vital signs reviewed and stable  Post vital signs: Reviewed and stable  Last Vitals:  Vitals Value Taken Time  BP    Temp    Pulse    Resp    SpO2      Last Pain:  Vitals:   08/28/18 0809  TempSrc:   PainSc: 4          Complications: No apparent anesthesia complications

## 2018-08-28 NOTE — Anesthesia Procedure Notes (Signed)
Spinal  Patient location during procedure: OR Start time: 08/28/2018 1:10 PM End time: 08/28/2018 1:14 PM Staffing Anesthesiologist: Leilani AbleHatchett, Darell Saputo, MD Performed: anesthesiologist  Preanesthetic Checklist Completed: patient identified, site marked, surgical consent, pre-op evaluation, timeout performed, IV checked, risks and benefits discussed and monitors and equipment checked Spinal Block Patient position: sitting Prep: site prepped and draped and DuraPrep Patient monitoring: continuous pulse ox and blood pressure Approach: midline Location: L3-4 Injection technique: single-shot Needle Needle type: Pencan  Needle gauge: 24 G Needle length: 10 cm Needle insertion depth: 7 cm Assessment Sensory level: T6

## 2018-08-28 NOTE — Addendum Note (Signed)
Addendum  created 08/28/18 1757 by Graciela HusbandsFussell, Paislee Szatkowski O, CRNA   Sign clinical note

## 2018-08-28 NOTE — Anesthesia Postprocedure Evaluation (Signed)
Anesthesia Post Note  Patient: Sheila GaudierMontana Jenkins  Procedure(s) Performed: CESAREAN SECTION WITH BILATERAL TUBAL LIGATION (N/A Abdomen)     Patient location during evaluation: PACU Anesthesia Type: Spinal Level of consciousness: awake Pain management: pain level controlled Vital Signs Assessment: post-procedure vital signs reviewed and stable Respiratory status: spontaneous breathing Postop Assessment: no headache, no backache, patient able to bend at knees, spinal receding and no apparent nausea or vomiting Anesthetic complications: no    Last Vitals:  Vitals:   08/28/18 1247 08/28/18 1434  BP: (!) 149/64 (!) 102/57  Pulse: 88 80  Resp:  16  Temp:  36.7 C    Last Pain:  Vitals:   08/28/18 1434  TempSrc: Oral  PainSc:    Pain Goal:                 Caren MacadamJohn F Kylon Philbrook Jr

## 2018-08-28 NOTE — Anesthesia Preprocedure Evaluation (Addendum)
Anesthesia Evaluation  Patient identified by MRN, date of birth, ID band Patient awake    Reviewed: Allergy & Precautions, NPO status , Patient's Chart, lab work & pertinent test results  Airway Mallampati: III       Dental no notable dental hx. (+) Teeth Intact   Pulmonary neg pulmonary ROS,    Pulmonary exam normal breath sounds clear to auscultation       Cardiovascular hypertension, Normal cardiovascular exam Rhythm:Regular Rate:Normal     Neuro/Psych negative neurological ROS  negative psych ROS   GI/Hepatic GERD  Medicated,  Endo/Other  Morbid obesity  Renal/GU   negative genitourinary   Musculoskeletal negative musculoskeletal ROS (+)   Abdominal (+) + obese,   Peds  Hematology  (+) anemia ,   Anesthesia Other Findings   Reproductive/Obstetrics (+) Pregnancy                            Anesthesia Physical Anesthesia Plan  ASA: III  Anesthesia Plan: Spinal   Post-op Pain Management:    Induction:   PONV Risk Score and Plan: 3 and Ondansetron, Dexamethasone and Scopolamine patch - Pre-op  Airway Management Planned: Natural Airway and Nasal Cannula  Additional Equipment:   Intra-op Plan:   Post-operative Plan:   Informed Consent: I have reviewed the patients History and Physical, chart, labs and discussed the procedure including the risks, benefits and alternatives for the proposed anesthesia with the patient or authorized representative who has indicated his/her understanding and acceptance.     Plan Discussed with: CRNA  Anesthesia Plan Comments:         Anesthesia Quick Evaluation

## 2018-08-28 NOTE — Anesthesia Postprocedure Evaluation (Signed)
Anesthesia Post Note  Patient: Sheila Jenkins  Procedure(s) Performed: CESAREAN SECTION WITH BILATERAL TUBAL LIGATION (N/A Abdomen)     Patient location during evaluation: Mother Baby Anesthesia Type: Spinal Level of consciousness: awake and alert and oriented Pain management: satisfactory to patient Vital Signs Assessment: post-procedure vital signs reviewed and stable Respiratory status: respiratory function stable and spontaneous breathing Cardiovascular status: blood pressure returned to baseline Postop Assessment: no headache, no backache, spinal receding, patient able to bend at knees and adequate PO intake Anesthetic complications: no    Last Vitals:  Vitals:   08/28/18 1557 08/28/18 1700  BP: (!) 112/93 120/66  Pulse: 74 64  Resp: 18 20  Temp: 36.6 C 36.7 C  SpO2:  99%    Last Pain:  Vitals:   08/28/18 1700  TempSrc: Oral  PainSc: 0-No pain   Pain Goal:                 Aven Cegielski

## 2018-08-29 LAB — CBC
HCT: 28.4 % — ABNORMAL LOW (ref 36.0–46.0)
Hemoglobin: 9.2 g/dL — ABNORMAL LOW (ref 12.0–15.0)
MCH: 29.2 pg (ref 26.0–34.0)
MCHC: 32.4 g/dL (ref 30.0–36.0)
MCV: 90.2 fL (ref 80.0–100.0)
Platelets: 220 10*3/uL (ref 150–400)
RBC: 3.15 MIL/uL — ABNORMAL LOW (ref 3.87–5.11)
RDW: 13.6 % (ref 11.5–15.5)
WBC: 13.5 10*3/uL — ABNORMAL HIGH (ref 4.0–10.5)
nRBC: 0 % (ref 0.0–0.2)

## 2018-08-29 LAB — CREATININE, SERUM
Creatinine, Ser: 0.52 mg/dL (ref 0.44–1.00)
GFR calc Af Amer: >60 mL/min (ref >60)
GFR calc non Af Amer: >60 mL/min (ref >60)

## 2018-08-29 LAB — CULTURE, OB URINE

## 2018-08-29 LAB — GC/CHLAMYDIA PROBE AMP
Chlamydia trachomatis, NAA: NEGATIVE
NEISSERIA GONORRHOEAE BY PCR: NEGATIVE

## 2018-08-29 MED ORDER — BENZOCAINE-MENTHOL 20-0.5 % EX AERO: 1.0000 "application " | INHALATION_SPRAY | Freq: Four times a day (QID) | CUTANEOUS | Status: DC | PRN

## 2018-08-29 NOTE — Progress Notes (Signed)
Patient ID: Judd GaudierMontana Presti, female   DOB: 03/05/1991, 27 y.o.   MRN: [id number] Called by nurse to come evaluate pt d/t excessive bleeding, RN changed pad/yellow pad- was 170 total, marked drainage on honeycomb dressing  S: doing well, no complaints, feels 'great' O: VSS, FF u-1, honeycomb dressing intact, drainage marked- hasn't gone outside of marking Pt had fleshy pedunculated growth at introitus excised, was cauterized w/ silver nitrate- no bleeding noted from this area. Few small clots removed from LUS, no active bleeding noted A: POD#0 PLTCS d/t breech P: spoke w/ RN, continue to monitor bleeding, let us know if becomes heavy Cheral MarkerKimberly R. Constantino Starace, CNM, WHNP-BC 08/29/2018 12:21 AM

## 2018-08-29 NOTE — Progress Notes (Signed)
Subjective: Postpartum Day 1: Cesarean Delivery Eating, drinking, ambulating well.  No flatus or BM yet. Foley just removed, hasn't voided yet. Lochia and pain wnl, 'they give me ibuprofen and tylenol, but I really don't think I need it'.  Denies dizziness, lightheadedness, or sob. Some soreness at introitus where fleshy growth removed, requests dermoplast. Denies ha, visual changes, ruq/epigastric pain, n/v.     Objective: Vital signs in last 24 hours: Temp:  [97.9 F (36.6 C)-98.7 F (37.1 C)] 98.7 F (37.1 C) (12/04 0620) Pulse Rate:  [64-88] 76 (12/04 0620) Resp:  [16-20] 18 (12/04 0620) BP: (102-149)/(57-93) 118/65 (12/04 0620) SpO2:  [98 %-99 %] 98 % (12/04 0111)  Physical Exam:  General: alert, cooperative and no distress Lochia: appropriate Uterine Fundus: firm Incision: healing well, drainage marked, slightly outside of margins marked last night, will have nurse change dressing DVT Evaluation: No evidence of DVT seen on physical exam. Negative Homan's sign. No cords or calf tenderness. No significant calf/ankle edema.  Recent Labs    08/27/18 2022 08/29/18 0545  HGB 11.3* 9.2*  HCT 35.2* 28.4*    Assessment/Plan: Status post Cesarean section. Doing well postoperatively.  Continue current care. Bottlefeeding BPs stable  Sheila Jenkins 08/29/2018, 7:14 AM

## 2018-08-30 ENCOUNTER — Encounter: Payer: Medicaid Other | Admitting: Advanced Practice Midwife

## 2018-08-30 LAB — BIRTH TISSUE RECOVERY COLLECTION (PLACENTA DONATION)

## 2018-08-30 MED ORDER — IBUPROFEN 600 MG PO TABS: 600.0000 mg | ORAL_TABLET | Freq: Four times a day (QID) | ORAL | 0 refills | Status: DC

## 2018-08-30 MED ORDER — SENNOSIDES-DOCUSATE SODIUM 8.6-50 MG PO TABS: 2.0000 | ORAL_TABLET | ORAL | 0 refills | Status: AC

## 2018-08-30 NOTE — Discharge Summary (Addendum)
Postpartum Discharge Summary     Patient Name: Sheila Jenkins DOB: 14-Mar-1991 MRN: [id number]  Date of admission: 08/27/2018 Delivering Provider: Levie Heritage   Date of discharge: 08/30/2018  Admitting diagnosis: 37wks high BP needs to be monitored Intrauterine pregnancy: [redacted]w[redacted]d     Secondary diagnosis:  Active Problems:   Supervision of normal pregnancy   History of gestational hypertension   Encounter for induction of labor   Indication for care in labor or delivery   Gestational hyperglycemia   Fetal malpresentation  Additional problems: None     Discharge diagnosis: Term Pregnancy Delivered and Gestational Hypertension                                                                                              Post partum procedures:None  Augmentation: Cytotec  Complications: None  Hospital course:  Induction of Labor With Cesarean Section  27 y.o. yo [phone] at [redacted]w[redacted]d was admitted to the hospital 08/27/2018 for induction of labor. Patient had a labor course significant for discovery of breech position s/p Cytotec administration. The patient went for cesarean section due to Elective Repeat and Malpresentation after an unsuccessful ECV, and delivered a Viable infant,08/28/2018  Membrane Rupture Time/Date: 1:34 PM ,08/28/2018  BTL was completed at the time of section. Details of operation can be found in separate operative Note.  Patient had an uncomplicated postpartum course. She remained normotensive and was not started on an antihypertensive. She is ambulating, tolerating a regular diet, passing flatus, and urinating well.  Patient is discharged home in stable condition on 08/30/18.                                   Magnesium Sulfate recieved: No BMZ received: No  Physical exam  Vitals:   08/29/18 0900 08/29/18 1408 08/30/18 0006 08/30/18 0522  BP: 107/60 108/69 117/75 123/70  Pulse: 69 80 79 86  Resp: 16 16 19 18   Temp: 98.1 F (36.7 C) 98 F (36.7 C) 98.2 F  (36.8 C) 97.8 F (36.6 C)  TempSrc: Oral Oral Oral Oral  SpO2:  97% 99% 99%  Weight:      Height:       General: alert, cooperative and no distress Lochia: appropriate Uterine Fundus: firm Incision: Healing well with no significant drainage, No significant erythema DVT Evaluation: No evidence of DVT seen on physical exam. Negative Homan's sign. No cords or calf tenderness. No significant calf/ankle edema. Labs: Lab Results  Component Value Date   WBC 13.5 (H) 08/29/2018   HGB 9.2 (L) 08/29/2018   HCT 28.4 (L) 08/29/2018   MCV 90.2 08/29/2018   PLT 220 08/29/2018   CMP Latest Ref Rng & Units 08/29/2018  Glucose 70 - 99 mg/dL -  BUN 6 - 20 mg/dL -  Creatinine 1.30 - 8.65 mg/dL 7.84  Sodium 696 - 295 mmol/L -  Potassium 3.5 - 5.1 mmol/L -  Chloride 98 - 111 mmol/L -  CO2 22 - 32 mmol/L -  Calcium 8.9 - 10.3 mg/dL -  Total  Protein 6.5 - 8.1 g/dL -  Total Bilirubin 0.3 - 1.2 mg/dL -  Alkaline Phos 38 - 161126 U/L -  AST 15 - 41 U/L -  ALT 0 - 44 U/L -    Discharge instruction: per After Visit Summary and "Baby and Me Booklet".  After visit meds:  Allergies as of 08/30/2018   No Known Allergies     Medication List    STOP taking these medications   omeprazole 20 MG capsule Commonly known as:  PRILOSEC   ondansetron 4 MG disintegrating tablet Commonly known as:  ZOFRAN-ODT   promethazine 25 MG tablet Commonly known as:  PHENERGAN     TAKE these medications   acetaminophen 500 MG tablet Commonly known as:  TYLENOL Take 1,000 mg by mouth every 8 (eight) hours as needed for mild pain or headache.   butalbital-acetaminophen-caffeine 50-325-40 MG tablet Commonly known as:  FIORICET, ESGIC Take 1 tablet by mouth 2 (two) times daily as needed for headache.   ibuprofen 600 MG tablet Commonly known as:  ADVIL,MOTRIN Take 1 tablet (600 mg total) by mouth every 6 (six) hours.   PRENATAL ADULT GUMMY/DHA/FA PO Take 2 each by mouth daily.   senna-docusate 8.6-50 MG  tablet Commonly known as:  Senokot-S Take 2 tablets by mouth daily for 7 days. Start taking on:  08/31/2018       Diet: routine diet  Activity: Advance as tolerated. Pelvic rest for 6 weeks.   Outpatient follow up:2 weeks for incision check Follow up Appt: Future Appointments  Date Time Provider Department Center  09/04/2018  2:15 PM Tilda BurrowFerguson, John V, MD [facility]  10/03/2018  2:30 PM Cheral MarkerBooker, Rani Sisney R, CNM [facility]   Follow up Visit:  Please schedule this patient for Postpartum visit in: 4 weeks with the following provider: Any provider For C/S patients schedule nurse incision check in weeks 2 weeks: yes High risk pregnancy complicated by: HTN Delivery mode:  CS Anticipated Birth Control:  BTL done with CS PP Procedures needed: Incision check   Newborn Data: Live born female  Birth Weight: 6 lb 8.6 oz (2965 g) APGAR: 9, 9  Newborn Delivery   Birth date/time:  08/28/2018 13:35:00 Delivery type:  C-Section, Low Transverse Trial of labor:  Yes C-section categorization:  Primary     Baby Feeding: Bottle Disposition:home with mother   08/30/2018 Awilda MetroSarah A Peiffer, MD  CNM attestation I have seen and examined this patient and agree with above documentation in the resident's note.   Sheila Jenkins is a 27 y.o. [phone] s/p pLTCS.   Pain is well controlled.  Plan for birth control is bilateral tubal ligation- s/p.  Method of Feeding: breast  PE:  BP 123/70 (BP Location: Left Arm)   Pulse 86   Temp 97.8 F (36.6 C) (Oral)   Resp 18   Ht 5\' 4"  (1.626 m)   Wt 108.1 kg   LMP 11/24/2017 (Exact Date)   SpO2 99%   Breastfeeding? Unknown   BMI 40.92 kg/m  Fundus firm  Recent Labs    08/29/18 0545  HGB 9.2*  HCT 28.4*     Plan: discharge today - postpartum care discussed - f/u clinic in 1 weeks for BP and incision check, then 4wks for postpartum visit   Arabella MerlesKimberly D Isabelle Matt, CNM 2:17 AM

## 2018-08-31 LAB — CULTURE, BETA STREP (GROUP B ONLY): Strep Gp B Culture: NEGATIVE

## 2018-09-01 DIAGNOSIS — O99810 Abnormal glucose complicating pregnancy: Secondary | ICD-10-CM | POA: Diagnosis present

## 2018-09-01 DIAGNOSIS — O329XX0 Maternal care for malpresentation of fetus, unspecified, not applicable or unspecified: Secondary | ICD-10-CM | POA: Diagnosis present

## 2018-09-03 ENCOUNTER — Telehealth: Payer: Self-pay | Admitting: *Deleted

## 2018-09-03 NOTE — Telephone Encounter (Signed)
Called to report hgb 8.9. Patient is not taking PNV but is going to start taking them again.

## 2018-09-04 ENCOUNTER — Encounter: Payer: Self-pay | Admitting: Obstetrics and Gynecology

## 2018-09-04 ENCOUNTER — Ambulatory Visit (INDEPENDENT_AMBULATORY_CARE_PROVIDER_SITE_OTHER): Payer: Medicaid Other | Admitting: Obstetrics and Gynecology

## 2018-09-04 VITALS — BP 122/74 | HR 56 | Ht 64.0 in | Wt 227.0 lb

## 2018-09-04 DIAGNOSIS — Z09 Encounter for follow-up examination after completed treatment for conditions other than malignant neoplasm: Secondary | ICD-10-CM | POA: Diagnosis not present

## 2018-09-04 DIAGNOSIS — O320XX0 Maternal care for unstable lie, not applicable or unspecified: Secondary | ICD-10-CM | POA: Diagnosis not present

## 2018-09-04 MED ORDER — HYDROCHLOROTHIAZIDE 25 MG PO TABS: 25.0000 mg | ORAL_TABLET | Freq: Every day | ORAL | 0 refills | Status: DC

## 2018-09-04 NOTE — Progress Notes (Signed)
   Subjective:  Sheila GaudierMontana Jenkins is a 27 y.o. female now 1 weeks status post Cesarean section for breech.   She has lots of fluid retention she is bottlefeeding Review of Systems Negative except dressing came off Sunday, 2 days ago   Diet:   Regular   Bowel movements : normal.  The patient is not having any pain.  Objective:  BP 122/74 (BP Location: Right Arm, Patient Position: Sitting, Cuff Size: Large)   Pulse (!) 56   Ht 5\' 4"  (1.626 m)   Wt 227 lb (103 kg)   Breastfeeding? No   BMI 38.96 kg/m  General:Well developed, well nourished.  No acute distress. Abdomen: Bowel sounds normal, soft, non-tender. Pelvic Exam:    E Incision(s): Slight separation of the skin edges.  The incision is almost in the natural skin crease and the patient has a large pannus so there is lots of physical movement separating the skin edges now that the dressing has come off and the Steri-Strips are no longer in place Steri-Strips replaced and photo taken to document current healing status.  Will recheck in 1 week     Assessment:  Post-Op 1 weeks s/p Cesarean section for breech   Slow healing of the incision postoperatively.   Plan:  1.Wound care discussed Steri-Strips were reapplied, the wound is too mobile 2. . current medications.  Add HCTZ 25 mg x 10 days 3. Activity restrictions: none 4. return to work: not applicable. 5. Follow up in 2 weeks.

## 2018-09-11 ENCOUNTER — Ambulatory Visit (INDEPENDENT_AMBULATORY_CARE_PROVIDER_SITE_OTHER): Payer: Medicaid Other | Admitting: Obstetrics and Gynecology

## 2018-09-11 ENCOUNTER — Encounter: Payer: Self-pay | Admitting: Obstetrics and Gynecology

## 2018-09-11 VITALS — Ht 64.0 in | Wt 213.0 lb

## 2018-09-11 DIAGNOSIS — Z09 Encounter for follow-up examination after completed treatment for conditions other than malignant neoplasm: Secondary | ICD-10-CM

## 2018-09-11 NOTE — Progress Notes (Signed)
   Subjective:  Sheila Jenkins is a 27 y.o. female now 2 weeks status post Cesarean section.  She was seen last week for wound separation had Steri-Strips placed and given instructions.   She is having no pain since last week.  She has a weeping support garment that she will begin to wear Review of Systems Negative except bottlefeeding   Diet:   Regular   Bowel movements : normal.  The patient is not having any pain.  Objective:  Ht 5\' 4"  (1.626 m)   Wt 213 lb (96.6 kg)   Breastfeeding No   BMI 36.56 kg/m  General:Well developed, well nourished.  No acute distress. Abdomen: Bowel sounds normal, soft, non-tender. Pelvic Exam:     Incision(s): N/A small 1 cm area of that still needs to healing by secondary intention, less than 5 mm depth no drainage no fever no redness     Assessment:  Post-Op 2 weeks s/p Cesarean section for breech, with tubal ligation performed.  Normal postop course postoperatively.   Plan:  1.Wound care discussed   2. . current medications.  Iron tablets vitamin C 3. Activity restrictions: Routine postpartum care 4. return to work: Already back at work. 5. Follow up in 4 weeks.

## 2018-10-03 ENCOUNTER — Ambulatory Visit (INDEPENDENT_AMBULATORY_CARE_PROVIDER_SITE_OTHER): Payer: Medicaid Other | Admitting: Women's Health

## 2018-10-03 ENCOUNTER — Encounter: Payer: Self-pay | Admitting: Women's Health

## 2018-10-03 NOTE — Progress Notes (Signed)
   POSTPARTUM VISIT Patient name: Sheila Jenkins MRN [id number]  Date of birth: 1991-08-16 Chief Complaint:   postpartum visit  History of Present Illness:   Sheila Jenkins is a 28 y.o. [phone] Caucasian female being seen today for a postpartum visit. She is 4 weeks postpartum following a primary cesarean section, low transverse incision at 37.1 gestational weeks d/t breech discovered during IOL for GHTN. Also had BTL. Anesthesia: spinal. Laceration: n/a. I have fully reviewed the prenatal and intrapartum course. Pregnancy complicated by GHTN. Postpartum course has been uncomplicated. Bleeding thin lochia. Bowel function is normal. Bladder function is normal.  Patient is not sexually active. Last sexual activity: prior to birth of baby.  Contraception method is tubal ligation.  [place] Postpartum Depression Screening: negative. Score 0.   Last pap 08/21/17.  Results were normal .  No LMP recorded.  Baby's course has been uncomplicated. Baby is feeding by bottle.  Review of Systems:   Pertinent items are noted in HPI Denies Abnormal vaginal discharge w/ itching/odor/irritation, headaches, visual changes, shortness of breath, chest pain, abdominal pain, severe nausea/vomiting, or problems with urination or bowel movements. Pertinent History Reviewed:  Reviewed past medical,surgical, obstetrical and family history.  Reviewed problem list, medications and allergies. OB History  Gravida Para Term Preterm AB Living  4 3 3   1 3   SAB TAB Ectopic Multiple Live Births  1     0 3    # Outcome Date GA Lbr Len/2nd Weight Sex Delivery Anes PTL Lv  4 Term 08/28/18 [redacted]w[redacted]d  6 lb 8.6 oz (2.965 kg) F CS-LTranv Spinal  LIV  3 SAB 02/20/17          2 Term 09/20/12 [redacted]w[redacted]d  8 lb (3.629 kg) M Vag-Spont EPI N LIV     Complications: Pregnancy induced hypertension  1 Term 03/02/11 [redacted]w[redacted]d  7 lb (3.175 kg) F Vag-Spont EPI N LIV     Complications: Gestational hypertension   Physical Assessment:   Vitals:   10/03/18 1439  BP: 119/86  Pulse: 77  Weight: 219 lb (99.3 kg)  Height: 5\' 4"  (1.626 m)  Body mass index is 37.59 kg/m.       Physical Examination:   General appearance: alert, well appearing, and in no distress  Mental status: alert, oriented to person, place, and time  Skin: warm & dry   Cardiovascular: normal heart rate noted   Respiratory: normal respiratory effort, no distress   Breasts: deferred, no complaints   Abdomen: soft, non-tender, c/s incision well-healed  Pelvic: VULVA: normal appearing vulva with no masses, tenderness or lesions, UTERUS: uterus is normal size, shape, consistency and nontender  Rectal: no hemorrhoids  Extremities: no edema       No results found for this or any previous visit (from the past 24 hour(s)).  Assessment & Plan:  1) Postpartum exam 2) 4 wks s/p PLTCS w/ BTL 3) Bottlefeeding 4) Depression screening  Meds: No orders of the defined types were placed in this encounter.   Follow-up: Return in about 1 year (around 10/04/2019) for Physical.   No orders of the defined types were placed in this encounter.   Cheral Marker CNM, [facility] 10/03/2018 3:03 PM

## 2018-10-05 ENCOUNTER — Telehealth: Payer: Self-pay | Admitting: Women's Health

## 2018-10-05 MED ORDER — HYDROCHLOROTHIAZIDE 25 MG PO TABS: 25.0000 mg | ORAL_TABLET | Freq: Every day | ORAL | 0 refills | Status: DC

## 2018-10-05 NOTE — Telephone Encounter (Signed)
Patient called, was seen this week, she's having right leg swelling and tingling.  She wants to know if we can prescribe water pills or if she can get some for the dollar store.  [facility]  [phone]

## 2018-10-05 NOTE — Telephone Encounter (Signed)
Pt reports swelling in her right leg only. No pain or redness.

## 2018-10-05 NOTE — Telephone Encounter (Signed)
Called patient back and informed her that rx was sent to pharmacy.

## 2018-10-18 ENCOUNTER — Encounter (HOSPITAL_COMMUNITY): Payer: Self-pay

## 2018-11-19 ENCOUNTER — Telehealth: Payer: Self-pay | Admitting: *Deleted

## 2018-11-19 NOTE — Telephone Encounter (Signed)
Pt states that she started having issues with her gallbladder at the beginning of her pregnancy. She states that now it has gotten worse. She states despite watching what she eats she has severe pain and nausea spells that last about an hour to hour and half. She states that it happens almost daily. Advised that I would send her request to a provider and let her know their recommendations. Pt verbalized understanding.

## 2018-11-20 ENCOUNTER — Other Ambulatory Visit: Payer: Self-pay | Admitting: *Deleted

## 2018-11-21 ENCOUNTER — Other Ambulatory Visit: Payer: Self-pay | Admitting: *Deleted

## 2018-11-21 ENCOUNTER — Telehealth: Payer: Self-pay | Admitting: Advanced Practice Midwife

## 2018-11-21 DIAGNOSIS — K8020 Calculus of gallbladder without cholecystitis without obstruction: Secondary | ICD-10-CM

## 2018-11-21 NOTE — Telephone Encounter (Signed)
Patient called, upset, in a lot of pain.  She wants to know if she has been referred to [place] yet.  I do not see a referral on her appt desk.  She is wanted to know what is going on or does she need to go to the emergency room.  [phone]

## 2018-11-21 NOTE — Telephone Encounter (Signed)
Patient states she is having pain daily from her gallbladder.  Informed I have placed an urgent referral into the system per [facility] but if she felt like she needed to go to the ER, then she should go.  Patient stated she would give it a little more time before going.

## 2018-11-22 ENCOUNTER — Ambulatory Visit: Payer: Medicaid Other | Admitting: General Surgery

## 2018-11-22 ENCOUNTER — Telehealth: Payer: Self-pay | Admitting: Advanced Practice Midwife

## 2018-11-22 NOTE — Telephone Encounter (Signed)
Sheila Jenkins at [facility] called and stated that this patient no showed for her appointment/hhs

## 2018-11-26 NOTE — Telephone Encounter (Signed)
Note sent to nurse. 

## 2018-11-27 ENCOUNTER — Encounter: Payer: Self-pay | Admitting: General Surgery

## 2018-11-27 ENCOUNTER — Ambulatory Visit (INDEPENDENT_AMBULATORY_CARE_PROVIDER_SITE_OTHER): Payer: Medicaid Other | Admitting: General Surgery

## 2018-11-27 VITALS — BP 128/85 | HR 74 | Temp 98.6°F | Resp 20 | Wt 223.0 lb

## 2018-11-27 DIAGNOSIS — K8020 Calculus of gallbladder without cholecystitis without obstruction: Secondary | ICD-10-CM

## 2018-11-27 NOTE — H&P (Signed)
Sheila Jenkins; [id number]; 01/14/1991   HPI Patient is a 28 year old white female who was referred to my care by [facility] for evaluation treatment of biliary colic secondary to cholelithiasis.  Patient was diagnosed with cholelithiasis during her pregnancy last year.  Since her delivery in December 2019, she has had intermittent episodes of right upper quadrant abdominal pain, nausea, and fatty food intolerance.  The pain radiates around to her right flank.  She has tried to adjust her diet, but she does have breakthrough biliary colic.  She denies any fever, chills, or jaundice.  She currently has 0/10 abdominal pain. Past Medical History:  Diagnosis Date  . Gestational hypertension   . Hypertension   . Varicose veins     Past Surgical History:  Procedure Laterality Date  . CESAREAN SECTION N/A 08/28/2018   Procedure: CESAREAN SECTION WITH BILATERAL TUBAL LIGATION;  Surgeon: Levie Heritage, DO;  [facility]: [facility];  Service: Obstetrics;  Laterality: N/A;  . VARICOSE VEIN SURGERY  11/2017  . WISDOM TOOTH EXTRACTION      Family History  Problem Relation Age of Onset  . Diabetes Maternal Grandmother   . Diabetes Maternal Grandfather   . Drug abuse Father   . Drug abuse Mother   . Asthma Brother   . Suicidality Brother   . Asthma Son   . Allergies Son   . Allergies Daughter     No current outpatient medications on file prior to visit.   No current facility-administered medications on file prior to visit.     No Known Allergies  Social History   Substance and Sexual Activity  Alcohol Use No  . Alcohol/week: 0.0 standard drinks    Social History   Tobacco Use  Smoking Status Never Smoker  Smokeless Tobacco Never Used    Review of Systems  Constitutional: Positive for malaise/fatigue.  HENT: Negative.   Eyes: Negative.   Respiratory: Negative.   Cardiovascular: Negative.   Gastrointestinal: Positive for abdominal pain, heartburn and nausea.   Genitourinary: Positive for frequency.  Musculoskeletal: Positive for neck pain.  Skin: Negative.   Neurological: Negative.   Endo/Heme/Allergies: Negative.   Psychiatric/Behavioral: Negative.     Objective   Vitals:   11/27/18 1442  BP: 128/85  Pulse: 74  Resp: 20  Temp: 98.6 F (37 C)    Physical Exam Vitals signs reviewed.  Constitutional:      Appearance: She is well-developed. She is not ill-appearing.  HENT:     Head: Normocephalic and atraumatic.  Eyes:     General: No scleral icterus. Cardiovascular:     Rate and Rhythm: Normal rate and regular rhythm.     Heart sounds: Normal heart sounds. No murmur. No friction rub. No gallop.   Pulmonary:     Effort: Pulmonary effort is normal. No respiratory distress.     Breath sounds: Normal breath sounds. No stridor. No wheezing, rhonchi or rales.  Abdominal:     General: Abdomen is flat. Bowel sounds are normal. There is no distension.     Palpations: Abdomen is soft. There is no hepatomegaly.     Tenderness: There is no abdominal tenderness.  Skin:    General: Skin is warm and dry.  Neurological:     Mental Status: She is alert and oriented to person, place, and time.   Primary care notes reviewed  Assessment  Biliary colic, cholelithiasis Plan   Patient is scheduled for laparoscopic cholecystectomy on 12/10/2018.  The risks and benefits of  the procedure including bleeding, infection, hepatobiliary injury, and the possibility of an open procedure were fully explained to the patient, who gave informed consent.  Patient is not breast-feeding. 

## 2018-11-27 NOTE — Progress Notes (Signed)
Sheila Jenkins; [id number]; 01/14/1991   HPI Patient is a 28 year old white female who was referred to my care by [facility] for evaluation treatment of biliary colic secondary to cholelithiasis.  Patient was diagnosed with cholelithiasis during her pregnancy last year.  Since her delivery in December 2019, she has had intermittent episodes of right upper quadrant abdominal pain, nausea, and fatty food intolerance.  The pain radiates around to her right flank.  She has tried to adjust her diet, but she does have breakthrough biliary colic.  She denies any fever, chills, or jaundice.  She currently has 0/10 abdominal pain. Past Medical History:  Diagnosis Date  . Gestational hypertension   . Hypertension   . Varicose veins     Past Surgical History:  Procedure Laterality Date  . CESAREAN SECTION N/A 08/28/2018   Procedure: CESAREAN SECTION WITH BILATERAL TUBAL LIGATION;  Surgeon: Levie Heritage, DO;  Location: [facility];  Service: Obstetrics;  Laterality: N/A;  . VARICOSE VEIN SURGERY  11/2017  . WISDOM TOOTH EXTRACTION      Family History  Problem Relation Age of Onset  . Diabetes Maternal Grandmother   . Diabetes Maternal Grandfather   . Drug abuse Father   . Drug abuse Mother   . Asthma Brother   . Suicidality Brother   . Asthma Son   . Allergies Son   . Allergies Daughter     No current outpatient medications on file prior to visit.   No current facility-administered medications on file prior to visit.     No Known Allergies  Social History   Substance and Sexual Activity  Alcohol Use No  . Alcohol/week: 0.0 standard drinks    Social History   Tobacco Use  Smoking Status Never Smoker  Smokeless Tobacco Never Used    Review of Systems  Constitutional: Positive for malaise/fatigue.  HENT: Negative.   Eyes: Negative.   Respiratory: Negative.   Cardiovascular: Negative.   Gastrointestinal: Positive for abdominal pain, heartburn and nausea.   Genitourinary: Positive for frequency.  Musculoskeletal: Positive for neck pain.  Skin: Negative.   Neurological: Negative.   Endo/Heme/Allergies: Negative.   Psychiatric/Behavioral: Negative.     Objective   Vitals:   11/27/18 1442  BP: 128/85  Pulse: 74  Resp: 20  Temp: 98.6 F (37 C)    Physical Exam Vitals signs reviewed.  Constitutional:      Appearance: She is well-developed. She is not ill-appearing.  HENT:     Head: Normocephalic and atraumatic.  Eyes:     General: No scleral icterus. Cardiovascular:     Rate and Rhythm: Normal rate and regular rhythm.     Heart sounds: Normal heart sounds. No murmur. No friction rub. No gallop.   Pulmonary:     Effort: Pulmonary effort is normal. No respiratory distress.     Breath sounds: Normal breath sounds. No stridor. No wheezing, rhonchi or rales.  Abdominal:     General: Abdomen is flat. Bowel sounds are normal. There is no distension.     Palpations: Abdomen is soft. There is no hepatomegaly.     Tenderness: There is no abdominal tenderness.  Skin:    General: Skin is warm and dry.  Neurological:     Mental Status: She is alert and oriented to person, place, and time.   Primary care notes reviewed  Assessment  Biliary colic, cholelithiasis Plan   Patient is scheduled for laparoscopic cholecystectomy on 12/10/2018.  The risks and benefits of  the procedure including bleeding, infection, hepatobiliary injury, and the possibility of an open procedure were fully explained to the patient, who gave informed consent.  Patient is not breast-feeding.

## 2018-11-27 NOTE — Patient Instructions (Signed)
Laparoscopic Cholecystectomy Laparoscopic cholecystectomy is surgery to remove the gallbladder. The gallbladder is a pear-shaped organ that lies beneath the liver on the right side of the body. The gallbladder stores bile, which is a fluid that helps the body to digest fats. Cholecystectomy is often done for inflammation of the gallbladder (cholecystitis). This condition is usually caused by a buildup of gallstones (cholelithiasis) in the gallbladder. Gallstones can block the flow of bile, which can result in inflammation and pain. In severe cases, emergency surgery may be required. This procedure is done though small incisions in your abdomen (laparoscopic surgery). A thin scope with a camera (laparoscope) is inserted through one incision. Thin surgical instruments are inserted through the other incisions. In some cases, a laparoscopic procedure may be turned into a type of surgery that is done through a larger incision (open surgery). Tell a health care provider about:  Any allergies you have.  All medicines you are taking, including vitamins, herbs, eye drops, creams, and over-the-counter medicines.  Any problems you or family members have had with anesthetic medicines.  Any blood disorders you have.  Any surgeries you have had.  Any medical conditions you have.  Whether you are pregnant or may be pregnant. What are the risks? Generally, this is a safe procedure. However, problems may occur, including:  Infection.  Bleeding.  Allergic reactions to medicines.  Damage to other structures or organs.  A stone remaining in the common bile duct. The common bile duct carries bile from the gallbladder into the small intestine.  A bile leak from the cyst duct that is clipped when your gallbladder is removed. What happens before the procedure?   Medicines  Ask your health care provider about: ? Changing or stopping your regular medicines. This is especially important if you are taking  diabetes medicines or blood thinners. ? Taking medicines such as aspirin and ibuprofen. These medicines can thin your blood. Do not take these medicines before your procedure if your health care provider instructs you not to.  You may be given antibiotic medicine to help prevent infection. General instructions  Let your health care provider know if you develop a cold or an infection before surgery.  Plan to have someone take you home from the hospital or clinic.  Ask your health care provider how your surgical site will be marked or identified. What happens during the procedure?   To reduce your risk of infection: ? Your health care team will wash or sanitize their hands. ? Your skin will be washed with soap. ? Hair may be removed from the surgical area.  An IV tube may be inserted into one of your veins.  You will be given one or more of the following: ? A medicine to help you relax (sedative). ? A medicine to make you fall asleep (general anesthetic).  A breathing tube will be placed in your mouth.  Your surgeon will make several small cuts (incisions) in your abdomen.  The laparoscope will be inserted through one of the small incisions. The camera on the laparoscope will send images to a TV screen (monitor) in the operating room. This lets your surgeon see inside your abdomen.  Air-like gas will be pumped into your abdomen. This will expand your abdomen to give the surgeon more room to perform the surgery.  Other tools that are needed for the procedure will be inserted through the other incisions. The gallbladder will be removed through one of the incisions.  Your common bile duct   may be examined. If stones are found in the common bile duct, they may be removed.  After your gallbladder has been removed, the incisions will be closed with stitches (sutures), staples, or skin glue.  Your incisions may be covered with a bandage (dressing). The procedure may vary among health  care providers and hospitals. What happens after the procedure?  Your blood pressure, heart rate, breathing rate, and blood oxygen level will be monitored until the medicines you were given have worn off.  You will be given medicines as needed to control your pain.  Do not drive for 24 hours if you were given a sedative. This information is not intended to replace advice given to you by your health care provider. Make sure you discuss any questions you have with your health care provider. Document Released: 09/12/2005 Document Revised: 08/10/2017 Document Reviewed: 02/29/2016 Elsevier Interactive Patient Education  2019 [employer]vier Inc.  

## 2018-11-30 NOTE — Patient Instructions (Signed)
[facility]  11/30/2018     @PREFPERIOPPHARMACY @   Your procedure is scheduled on  12/10/2018   Report to [facility] at  700   A.M.  Call this number if you have problems the morning of surgery:  [phone]   Remember:  Do not eat or drink after midnight.                       Take these medicines the morning of surgery with A SIP OF WATER  None    Do not wear jewelry, make-up or nail polish.  Do not wear lotions, powders, or perfumes, or deodorant.  Do not shave 48 hours prior to surgery.  Men may shave face and neck.  Do not bring valuables to the hospital.  [facility] is not responsible for any belongings or valuables.  Contacts, dentures or bridgework may not be worn into surgery.  Leave your suitcase in the car.  After surgery it may be brought to your room.  For patients admitted to the hospital, discharge time will be determined by your treatment team.  Patients discharged the day of surgery will not be allowed to drive home.   Name and phone number of your driver:   family Special instructions:  None  Please read over the following fact sheets that you were given. Anesthesia Post-op Instructions and Care and Recovery After Surgery       Laparoscopic Cholecystectomy, Care After This sheet gives you information about how to care for yourself after your procedure. Your health care provider may also give you more specific instructions. If you have problems or questions, contact your health care provider. What can I expect after the procedure? After the procedure, it is common to have:  Pain at your incision sites. You will be given medicines to control this pain.  Mild nausea or vomiting.  Bloating and possible shoulder pain from the air-like gas that was used during the procedure. Follow these instructions at home: Incision care   Follow instructions from your health care provider about how to take care of your incisions. Make sure  you: ? Wash your hands with soap and water before you change your bandage (dressing). If soap and water are not available, use hand sanitizer. ? Change your dressing as told by your health care provider. ? Leave stitches (sutures), skin glue, or adhesive strips in place. These skin closures may need to be in place for 2 weeks or longer. If adhesive strip edges start to loosen and curl up, you may trim the loose edges. Do not remove adhesive strips completely unless your health care provider tells you to do that.  Do not take baths, swim, or use a hot tub until your health care provider approves. Ask your health care provider if you can take showers. You may only be allowed to take sponge baths for bathing.  Check your incision area every day for signs of infection. Check for: ? More redness, swelling, or pain. ? More fluid or blood. ? Warmth. ? Pus or a bad smell. Activity  Do not drive or use heavy machinery while taking prescription pain medicine.  Do not lift anything that is heavier than 10 lb (4.5 kg) until your health care provider approves.  Do not play contact sports until your health care provider approves.  Do not drive for 24 hours if you were given a medicine to help you relax (sedative).  Rest  as needed. Do not return to work or school until your health care provider approves. General instructions  Take over-the-counter and prescription medicines only as told by your health care provider.  To prevent or treat constipation while you are taking prescription pain medicine, your health care provider may recommend that you: ? Drink enough fluid to keep your urine clear or pale yellow. ? Take over-the-counter or prescription medicines. ? Eat foods that are high in fiber, such as fresh fruits and vegetables, whole grains, and beans. ? Limit foods that are high in fat and processed sugars, such as fried and sweet foods. Contact a health care provider if:  You develop a  rash.  You have more redness, swelling, or pain around your incisions.  You have more fluid or blood coming from your incisions.  Your incisions feel warm to the touch.  You have pus or a bad smell coming from your incisions.  You have a fever.  One or more of your incisions breaks open. Get help right away if:  You have trouble breathing.  You have chest pain.  You have increasing pain in your shoulders.  You faint or feel dizzy when you stand.  You have severe pain in your abdomen.  You have nausea or vomiting that lasts for more than one day.  You have leg pain. This information is not intended to replace advice given to you by your health care provider. Make sure you discuss any questions you have with your health care provider. Document Released: 09/12/2005 Document Revised: 04/02/2016 Document Reviewed: 02/29/2016 Elsevier Interactive Patient Education  2019 [employer] Inc[employer] General Anesthesia, Adult, Care After This sheet gives you information about how to care for yourself after your procedure. Your health care provider may also give you more specific instructions. If you have problems or questions, contact your health care provider. What can I expect after the procedure? After the procedure, the following side effects are common:  Pain or discomfort at the IV site.  Nausea.  Vomiting.  Sore throat.  Trouble concentrating.  Feeling cold or chills.  Weak or tired.  Sleepiness and fatigue.  Soreness and body aches. These side effects can affect parts of the body that were not involved in surgery. Follow these instructions at home:  For at least 24 hours after the procedure:  Have a responsible adult stay with you. It is important to have someone help care for you until you are awake and alert.  Rest as needed.  Do not: ? Participate in activities in which you could fall or become injured. ? Drive. ? Use heavy machinery. ? Drink alcohol. ? Take  sleeping pills or medicines that cause drowsiness. ? Make important decisions or sign legal documents. ? Take care of children on your own. Eating and drinking  Follow any instructions from your health care provider about eating or drinking restrictions.  When you feel hungry, start by eating small amounts of foods that are soft and easy to digest (bland), such as toast. Gradually return to your regular diet.  Drink enough fluid to keep your urine pale yellow.  If you vomit, rehydrate by drinking water, juice, or clear broth. General instructions  If you have sleep apnea, surgery and certain medicines can increase your risk for breathing problems. Follow instructions from your health care provider about wearing your sleep device: ? Anytime you are sleeping, including during daytime naps. ? While taking prescription pain medicines, sleeping medicines, or medicines that make you drowsy.  Return to your normal activities as told by your health care provider. Ask your health care provider what activities are safe for you.  Take over-the-counter and prescription medicines only as told by your health care provider.  If you smoke, do not smoke without supervision.  Keep all follow-up visits as told by your health care provider. This is important. Contact a health care provider if:  You have nausea or vomiting that does not get better with medicine.  You cannot eat or drink without vomiting.  You have pain that does not get better with medicine.  You are unable to pass urine.  You develop a skin rash.  You have a fever.  You have redness around your IV site that gets worse. Get help right away if:  You have difficulty breathing.  You have chest pain.  You have blood in your urine or stool, or you vomit blood. Summary  After the procedure, it is common to have a sore throat or nausea. It is also common to feel tired.  Have a responsible adult stay with you for the first 24  hours after general anesthesia. It is important to have someone help care for you until you are awake and alert.  When you feel hungry, start by eating small amounts of foods that are soft and easy to digest (bland), such as toast. Gradually return to your regular diet.  Drink enough fluid to keep your urine pale yellow.  Return to your normal activities as told by your health care provider. Ask your health care provider what activities are safe for you. This information is not intended to replace advice given to you by your health care provider. Make sure you discuss any questions you have with your health care provider. Document Released: 12/19/2000 Document Revised: 04/28/2017 Document Reviewed: 04/28/2017 Elsevier Interactive Patient Education  2019 [employer].

## 2018-12-04 ENCOUNTER — Encounter (HOSPITAL_COMMUNITY)
Admission: RE | Admit: 2018-12-04 | Discharge: 2018-12-04 | Disposition: A | Discharge status: Home / Self Care | Payer: Medicaid Other | Source: Ambulatory Visit | Attending: General Surgery | Admitting: General Surgery

## 2018-12-04 ENCOUNTER — Other Ambulatory Visit: Payer: Self-pay

## 2018-12-04 ENCOUNTER — Encounter (HOSPITAL_COMMUNITY): Payer: Self-pay

## 2018-12-04 DIAGNOSIS — Z01812 Encounter for preprocedural laboratory examination: Secondary | ICD-10-CM | POA: Diagnosis not present

## 2018-12-04 HISTORY — DX: Other specified postprocedural states: R11.2

## 2018-12-04 HISTORY — DX: Nausea with vomiting, unspecified: Z98.890

## 2018-12-04 LAB — CBC WITH DIFFERENTIAL/PLATELET
Abs Immature Granulocytes: 0.01 10*3/uL (ref 0.00–0.07)
Basophils Absolute: 0.1 10*3/uL (ref 0.0–0.1)
Basophils Relative: 1 %
Eosinophils Absolute: 0.1 10*3/uL (ref 0.0–0.5)
Eosinophils Relative: 1 %
HEMATOCRIT: 38.3 % (ref 36.0–46.0)
HEMOGLOBIN: 12.1 g/dL (ref 12.0–15.0)
Immature Granulocytes: 0 %
LYMPHS PCT: 32 %
Lymphs Abs: 1.8 10*3/uL (ref 0.7–4.0)
MCH: 27.5 pg (ref 26.0–34.0)
MCHC: 31.6 g/dL (ref 30.0–36.0)
MCV: 87 fL (ref 80.0–100.0)
Monocytes Absolute: 0.6 10*3/uL (ref 0.1–1.0)
Monocytes Relative: 11 %
Neutro Abs: 3.1 10*3/uL (ref 1.7–7.7)
Neutrophils Relative %: 55 %
Platelets: 371 10*3/uL (ref 150–400)
RBC: 4.4 MIL/uL (ref 3.87–5.11)
RDW: 14.6 % (ref 11.5–15.5)
WBC: 5.7 10*3/uL (ref 4.0–10.5)
nRBC: 0 % (ref 0.0–0.2)

## 2018-12-04 LAB — COMPREHENSIVE METABOLIC PANEL
ALBUMIN: 4.5 g/dL (ref 3.5–5.0)
ALT: 34 U/L (ref 0–44)
AST: 27 U/L (ref 15–41)
Alkaline Phosphatase: 60 U/L (ref 38–126)
Anion gap: 9 (ref 5–15)
BILIRUBIN TOTAL: 0.1 mg/dL — AB (ref 0.3–1.2)
BUN: 18 mg/dL (ref 6–20)
CO2: 21 mmol/L — ABNORMAL LOW (ref 22–32)
Calcium: 8.8 mg/dL — ABNORMAL LOW (ref 8.9–10.3)
Chloride: 106 mmol/L (ref 98–111)
Creatinine, Ser: 0.7 mg/dL (ref 0.44–1.00)
GFR calc Af Amer: >60 mL/min (ref >60)
GFR calc non Af Amer: >60 mL/min (ref >60)
Glucose, Bld: 90 mg/dL (ref 70–99)
Potassium: 3.9 mmol/L (ref 3.5–5.1)
SODIUM: 136 mmol/L (ref 135–145)
Total Protein: 7.5 g/dL (ref 6.5–8.1)

## 2018-12-04 LAB — HCG, SERUM, QUALITATIVE: Preg, Serum: NEGATIVE

## 2018-12-10 ENCOUNTER — Encounter (HOSPITAL_COMMUNITY)
Admission: RE | Disposition: A | Discharge status: Home / Self Care | Payer: Self-pay | Source: Home / Self Care | Attending: General Surgery

## 2018-12-10 ENCOUNTER — Ambulatory Visit (HOSPITAL_COMMUNITY)
Admission: RE | Admit: 2018-12-10 | Discharge: 2018-12-10 | Disposition: A | Discharge status: Home / Self Care | Payer: Medicaid Other | Attending: General Surgery | Admitting: General Surgery

## 2018-12-10 ENCOUNTER — Ambulatory Visit (HOSPITAL_COMMUNITY): Payer: Medicaid Other | Admitting: Anesthesiology

## 2018-12-10 ENCOUNTER — Other Ambulatory Visit: Payer: Self-pay

## 2018-12-10 DIAGNOSIS — K8020 Calculus of gallbladder without cholecystitis without obstruction: Secondary | ICD-10-CM

## 2018-12-10 DIAGNOSIS — K8064 Calculus of gallbladder and bile duct with chronic cholecystitis without obstruction: Secondary | ICD-10-CM | POA: Diagnosis present

## 2018-12-10 DIAGNOSIS — I10 Essential (primary) hypertension: Secondary | ICD-10-CM | POA: Insufficient documentation

## 2018-12-10 HISTORY — PX: CHOLECYSTECTOMY: SHX55

## 2018-12-10 SURGERY — LAPAROSCOPIC CHOLECYSTECTOMY
Anesthesia: General

## 2018-12-10 MED ORDER — ONDANSETRON HCL 4 MG/2ML IJ SOLN
INTRAMUSCULAR | Status: AC
  Filled 2018-12-10: qty 2

## 2018-12-10 MED ORDER — LIDOCAINE 2% (20 MG/ML) 5 ML SYRINGE
INTRAMUSCULAR | Status: DC | PRN
  Administered 2018-12-10: 40 mg via INTRAVENOUS

## 2018-12-10 MED ORDER — SUGAMMADEX SODIUM 500 MG/5ML IV SOLN
INTRAVENOUS | Status: DC | PRN
  Administered 2018-12-10: 203.2 mg via INTRAVENOUS

## 2018-12-10 MED ORDER — SUGAMMADEX SODIUM 500 MG/5ML IV SOLN
INTRAVENOUS | Status: AC
  Filled 2018-12-10: qty 5

## 2018-12-10 MED ORDER — LACTATED RINGERS IV SOLN
INTRAVENOUS | Status: DC
  Administered 2018-12-10 (×2): via INTRAVENOUS

## 2018-12-10 MED ORDER — CYCLOPENTOLATE-PHENYLEPHRINE 0.2-1 % OP SOLN
OPHTHALMIC | Status: AC
  Filled 2018-12-10: qty 2

## 2018-12-10 MED ORDER — DEXAMETHASONE SODIUM PHOSPHATE 10 MG/ML IJ SOLN
INTRAMUSCULAR | Status: AC
  Filled 2018-12-10: qty 1

## 2018-12-10 MED ORDER — GLYCOPYRROLATE PF 0.2 MG/ML IJ SOSY
PREFILLED_SYRINGE | INTRAMUSCULAR | Status: AC
  Filled 2018-12-10: qty 1

## 2018-12-10 MED ORDER — BUPIVACAINE LIPOSOME 1.3 % IJ SUSP
INTRAMUSCULAR | Status: DC | PRN
  Administered 2018-12-10: 20 mL

## 2018-12-10 MED ORDER — HEMOSTATIC AGENTS (NO CHARGE) OPTIME
TOPICAL | Status: DC | PRN
  Administered 2018-12-10 (×2): 1 via TOPICAL

## 2018-12-10 MED ORDER — NEOMYCIN-POLYMYXIN-DEXAMETH 3.5-10000-0.1 OP SUSP
OPHTHALMIC | Status: AC
  Filled 2018-12-10: qty 5

## 2018-12-10 MED ORDER — PROPOFOL 10 MG/ML IV BOLUS
INTRAVENOUS | Status: DC | PRN
  Administered 2018-12-10: 150 mg via INTRAVENOUS

## 2018-12-10 MED ORDER — SODIUM CHLORIDE 0.9 % IR SOLN
Status: DC | PRN
  Administered 2018-12-10: 1000 mL

## 2018-12-10 MED ORDER — DEXAMETHASONE SODIUM PHOSPHATE 4 MG/ML IJ SOLN
INTRAMUSCULAR | Status: DC | PRN
  Administered 2018-12-10: 8 mg via INTRAVENOUS

## 2018-12-10 MED ORDER — LIDOCAINE HCL 3.5 % OP GEL
OPHTHALMIC | Status: AC
  Filled 2018-12-10: qty 1

## 2018-12-10 MED ORDER — ONDANSETRON HCL 4 MG/2ML IJ SOLN
INTRAMUSCULAR | Status: DC | PRN
  Administered 2018-12-10: 4 mg via INTRAVENOUS

## 2018-12-10 MED ORDER — SUCCINYLCHOLINE CHLORIDE 20 MG/ML IJ SOLN
INTRAMUSCULAR | Status: DC | PRN
  Administered 2018-12-10: 120 mg via INTRAVENOUS

## 2018-12-10 MED ORDER — CHLORHEXIDINE GLUCONATE CLOTH 2 % EX PADS: 6.0000 | MEDICATED_PAD | Freq: Once | CUTANEOUS | Status: DC

## 2018-12-10 MED ORDER — PHENYLEPHRINE HCL 2.5 % OP SOLN
OPHTHALMIC | Status: AC
  Filled 2018-12-10: qty 15

## 2018-12-10 MED ORDER — GLYCOPYRROLATE PF 0.2 MG/ML IJ SOSY
PREFILLED_SYRINGE | INTRAMUSCULAR | Status: DC | PRN
  Administered 2018-12-10: .2 mg via INTRAVENOUS

## 2018-12-10 MED ORDER — PROMETHAZINE HCL 25 MG/ML IJ SOLN: 6.2500 mg | INTRAMUSCULAR | Status: DC | PRN

## 2018-12-10 MED ORDER — PROPOFOL 10 MG/ML IV BOLUS
INTRAVENOUS | Status: AC
  Filled 2018-12-10: qty 20

## 2018-12-10 MED ORDER — ARTIFICIAL TEARS OPHTHALMIC OINT
TOPICAL_OINTMENT | OPHTHALMIC | Status: AC
  Filled 2018-12-10: qty 3.5

## 2018-12-10 MED ORDER — POVIDONE-IODINE 10 % OINT PACKET
TOPICAL_OINTMENT | CUTANEOUS | Status: DC | PRN
  Administered 2018-12-10: 1 via TOPICAL

## 2018-12-10 MED ORDER — ROCURONIUM BROMIDE 10 MG/ML (PF) SYRINGE
PREFILLED_SYRINGE | INTRAVENOUS | Status: AC
  Filled 2018-12-10: qty 10

## 2018-12-10 MED ORDER — KETOROLAC TROMETHAMINE 30 MG/ML IJ SOLN
30.0000 mg | Freq: Once | INTRAMUSCULAR | Status: AC
  Administered 2018-12-10: 30 mg via INTRAVENOUS
  Filled 2018-12-10: qty 1

## 2018-12-10 MED ORDER — HYDROCODONE-ACETAMINOPHEN 7.5-325 MG PO TABS: 1.0000 | ORAL_TABLET | Freq: Once | ORAL | Status: DC | PRN

## 2018-12-10 MED ORDER — HYDROMORPHONE HCL 1 MG/ML IJ SOLN
0.2500 mg | INTRAMUSCULAR | Status: DC | PRN
  Administered 2018-12-10 (×4): 0.5 mg via INTRAVENOUS
  Filled 2018-12-10 (×4): qty 0.5

## 2018-12-10 MED ORDER — HYDROCODONE-ACETAMINOPHEN 5-325 MG PO TABS: 1.0000 | ORAL_TABLET | ORAL | 0 refills | Status: DC | PRN

## 2018-12-10 MED ORDER — TETRACAINE HCL 0.5 % OP SOLN
OPHTHALMIC | Status: AC
  Filled 2018-12-10: qty 4

## 2018-12-10 MED ORDER — LIDOCAINE HCL (PF) 1 % IJ SOLN
INTRAMUSCULAR | Status: AC
  Filled 2018-12-10: qty 2

## 2018-12-10 MED ORDER — MIDAZOLAM HCL 2 MG/2ML IJ SOLN: 0.5000 mg | Freq: Once | INTRAMUSCULAR | Status: DC | PRN

## 2018-12-10 MED ORDER — CIPROFLOXACIN IN D5W 400 MG/200ML IV SOLN
400.0000 mg | INTRAVENOUS | Status: AC
  Administered 2018-12-10: 400 mg via INTRAVENOUS
  Filled 2018-12-10: qty 200

## 2018-12-10 MED ORDER — ROCURONIUM BROMIDE 50 MG/5ML IV SOSY
PREFILLED_SYRINGE | INTRAVENOUS | Status: DC | PRN
  Administered 2018-12-10: 30 mg via INTRAVENOUS

## 2018-12-10 MED ORDER — POVIDONE-IODINE 10 % EX OINT
TOPICAL_OINTMENT | CUTANEOUS | Status: AC
  Filled 2018-12-10: qty 1

## 2018-12-10 MED ORDER — FENTANYL CITRATE (PF) 250 MCG/5ML IJ SOLN
INTRAMUSCULAR | Status: AC
  Filled 2018-12-10: qty 5

## 2018-12-10 MED ORDER — BUPIVACAINE LIPOSOME 1.3 % IJ SUSP
INTRAMUSCULAR | Status: AC
  Filled 2018-12-10: qty 20

## 2018-12-10 MED ORDER — FENTANYL CITRATE (PF) 100 MCG/2ML IJ SOLN
INTRAMUSCULAR | Status: DC | PRN
  Administered 2018-12-10 (×5): 50 ug via INTRAVENOUS
  Administered 2018-12-10: 100 ug via INTRAVENOUS

## 2018-12-10 MED ORDER — FENTANYL CITRATE (PF) 100 MCG/2ML IJ SOLN
INTRAMUSCULAR | Status: AC
  Filled 2018-12-10: qty 2

## 2018-12-10 MED ORDER — ONDANSETRON HCL 4 MG PO TABS: 4.0000 mg | ORAL_TABLET | Freq: Three times a day (TID) | ORAL | 1 refills | Status: DC | PRN

## 2018-12-10 SURGICAL SUPPLY — 47 items
APPLICATOR ARISTA FLEXITIP XL (MISCELLANEOUS) ×3 IMPLANT
APPLIER CLIP ROT 10 11.4 M/L (STAPLE) ×3
BAG RETRIEVAL 10 (BASKET) ×1
BAG RETRIEVAL 10MM (BASKET) ×1
CHLORAPREP W/TINT 26ML (MISCELLANEOUS) ×3 IMPLANT
CLIP APPLIE ROT 10 11.4 M/L (STAPLE) ×1 IMPLANT
CLOTH BEACON ORANGE TIMEOUT ST (SAFETY) ×3 IMPLANT
COVER LIGHT HANDLE STERIS (MISCELLANEOUS) ×6 IMPLANT
COVER WAND RF STERILE (DRAPES) ×3 IMPLANT
ELECT REM PT RETURN 9FT ADLT (ELECTROSURGICAL) ×3
ELECTRODE REM PT RTRN 9FT ADLT (ELECTROSURGICAL) ×1 IMPLANT
GLOVE BIOGEL M 6.5 STRL (GLOVE) ×3 IMPLANT
GLOVE BIOGEL M 7.0 STRL (GLOVE) ×3 IMPLANT
GLOVE BIOGEL PI IND STRL 6.5 (GLOVE) ×1 IMPLANT
GLOVE BIOGEL PI IND STRL 7.0 (GLOVE) ×1 IMPLANT
GLOVE BIOGEL PI INDICATOR 6.5 (GLOVE) ×2
GLOVE BIOGEL PI INDICATOR 7.0 (GLOVE) ×2
GLOVE SURG SS PI 7.5 STRL IVOR (GLOVE) ×3 IMPLANT
GOWN STRL REUS W/TWL LRG LVL3 (GOWN DISPOSABLE) ×9 IMPLANT
HEMOSTAT ARISTA ABSORB 3G PWDR (HEMOSTASIS) ×3 IMPLANT
HEMOSTAT SNOW SURGICEL 2X4 (HEMOSTASIS) ×3 IMPLANT
INST SET LAPROSCOPIC AP (KITS) ×3 IMPLANT
KIT TURNOVER KIT A (KITS) ×3 IMPLANT
MANIFOLD NEPTUNE II (INSTRUMENTS) ×3 IMPLANT
NEEDLE HYPO 18GX1.5 BLUNT FILL (NEEDLE) ×3 IMPLANT
NEEDLE HYPO 22GX1.5 SAFETY (NEEDLE) ×3 IMPLANT
NEEDLE INSUFFLATION 14GA 120MM (NEEDLE) ×3 IMPLANT
NS IRRIG 1000ML POUR BTL (IV SOLUTION) ×3 IMPLANT
PACK LAP CHOLE LZT030E (CUSTOM PROCEDURE TRAY) ×3 IMPLANT
PAD ARMBOARD 7.5X6 YLW CONV (MISCELLANEOUS) ×3 IMPLANT
SET BASIN LINEN APH (SET/KITS/TRAYS/PACK) ×3 IMPLANT
SLEEVE ENDOPATH XCEL 5M (ENDOMECHANICALS) ×3 IMPLANT
SPONGE GAUZE 2X2 8PLY STER LF (GAUZE/BANDAGES/DRESSINGS) ×4
SPONGE GAUZE 2X2 8PLY STRL LF (GAUZE/BANDAGES/DRESSINGS) ×8 IMPLANT
STAPLER VISISTAT (STAPLE) ×3 IMPLANT
SUT VICRYL 0 UR6 27IN ABS (SUTURE) ×3 IMPLANT
SYR 20CC LL (SYRINGE) ×3 IMPLANT
SYS BAG RETRIEVAL 10MM (BASKET) ×1
SYSTEM BAG RETRIEVAL 10MM (BASKET) ×1 IMPLANT
TAPE CLOTH SURG 4X10 WHT LF (GAUZE/BANDAGES/DRESSINGS) ×3 IMPLANT
TROCAR ENDO BLADELESS 11MM (ENDOMECHANICALS) ×3 IMPLANT
TROCAR XCEL NON-BLD 5MMX100MML (ENDOMECHANICALS) ×3 IMPLANT
TROCAR XCEL UNIV SLVE 11M 100M (ENDOMECHANICALS) ×3 IMPLANT
TUBE CONNECTING 12'X1/4 (SUCTIONS) ×1
TUBE CONNECTING 12X1/4 (SUCTIONS) ×2 IMPLANT
TUBING INSUFFLATION (TUBING) ×3 IMPLANT
WARMER LAPAROSCOPE (MISCELLANEOUS) ×3 IMPLANT

## 2018-12-10 NOTE — Anesthesia Postprocedure Evaluation (Signed)
Anesthesia Post Note  Patient: Sheila Jenkins  Procedure(s) Performed: LAPAROSCOPIC CHOLECYSTECTOMY (N/A )  Patient location during evaluation: PACU Anesthesia Type: General Level of consciousness: awake and alert and oriented Pain management: pain level controlled Vital Signs Assessment: post-procedure vital signs reviewed and stable Respiratory status: spontaneous breathing Cardiovascular status: stable Postop Assessment: no apparent nausea or vomiting Anesthetic complications: no     Last Vitals:  Vitals:   12/10/18 0930 12/10/18 0945  BP: 117/70 113/79  Pulse: 71 (!) 55  Resp: 14 17  Temp:    SpO2: 100% 100%    Last Pain:  Vitals:   12/10/18 0945  PainSc: 10-Worst pain ever                 ADAMS, AMY A

## 2018-12-10 NOTE — Interval H&P Note (Signed)
History and Physical Interval Note:  12/10/2018 7:17 AM  Sheila Jenkins  has presented today for surgery, with the diagnosis of cholelithiasis.  The various methods of treatment have been discussed with the patient and family. After consideration of risks, benefits and other options for treatment, the patient has consented to  Procedure(s): LAPAROSCOPIC CHOLECYSTECTOMY (N/A) as a surgical intervention.  The patient's history has been reviewed, patient examined, no change in status, stable for surgery.  I have reviewed the patient's chart and labs.  Questions were answered to the patient's satisfaction.     [name]

## 2018-12-10 NOTE — Anesthesia Preprocedure Evaluation (Signed)
Anesthesia Evaluation  Patient identified by MRN, date of birth, ID band Patient awake    Reviewed: Allergy & Precautions, NPO status , Patient's Chart, lab work & pertinent test results  History of Anesthesia Complications (+) PONV  Airway Mallampati: I  TM Distance: >3 FB Neck ROM: Full    Dental no notable dental hx. (+) Teeth Intact   Pulmonary neg pulmonary ROS,    Pulmonary exam normal breath sounds clear to auscultation       Cardiovascular Exercise Tolerance: Good hypertension, negative cardio ROS Normal cardiovascular examI Rhythm:Regular Rate:Normal  No current HTN meds    Neuro/Psych negative neurological ROS  negative psych ROS   GI/Hepatic negative GI ROS, Neg liver ROS,   Endo/Other  negative endocrine ROS  Renal/GU negative Renal ROS  negative genitourinary   Musculoskeletal negative musculoskeletal ROS (+)   Abdominal   Peds negative pediatric ROS (+)  Hematology negative hematology ROS (+)   Anesthesia Other Findings   Reproductive/Obstetrics negative OB ROS                             Anesthesia Physical Anesthesia Plan  ASA: II  Anesthesia Plan: General   Post-op Pain Management:    Induction: Intravenous  PONV Risk Score and Plan:   Airway Management Planned: Oral ETT  Additional Equipment:   Intra-op Plan:   Post-operative Plan: Extubation in OR  Informed Consent: I have reviewed the patients History and Physical, chart, labs and discussed the procedure including the risks, benefits and alternatives for the proposed anesthesia with the patient or authorized representative who has indicated his/her understanding and acceptance.     Dental advisory given  Plan Discussed with: CRNA  Anesthesia Plan Comments:         Anesthesia Quick Evaluation

## 2018-12-10 NOTE — Transfer of Care (Signed)
Immediate Anesthesia Transfer of Care Note  Patient: Sheila Jenkins  Procedure(s) Performed: LAPAROSCOPIC CHOLECYSTECTOMY (N/A )  Patient Location: PACU  Anesthesia Type:General  Level of Consciousness: awake, oriented and patient cooperative  Airway & Oxygen Therapy: Patient Spontanous Breathing and Patient connected to nasal cannula oxygen  Post-op Assessment: Report given to RN and Post -op Vital signs reviewed and stable  Post vital signs: Reviewed and stable  Last Vitals:  Vitals Value Taken Time  BP 118/68 12/10/2018  9:21 AM  Temp    Pulse 60 12/10/2018  9:26 AM  Resp 14 12/10/2018  9:26 AM  SpO2 97 % 12/10/2018  9:26 AM  Vitals shown include unvalidated device data.  Last Pain:  Vitals:   12/10/18 0722  PainSc: 0-No pain         Complications: No apparent anesthesia complications

## 2018-12-10 NOTE — Anesthesia Procedure Notes (Signed)
Procedure Name: Intubation Date/Time: 12/10/2018 8:39 AM Performed by: Andree Elk, Gracianna Vink A, CRNA Pre-anesthesia Checklist: Patient identified, Patient being monitored, Timeout performed, Emergency Drugs available and Suction available Patient Re-evaluated:Patient Re-evaluated prior to induction Oxygen Delivery Method: Circle system utilized Preoxygenation: Pre-oxygenation with 100% oxygen Induction Type: IV induction Ventilation: Mask ventilation without difficulty Laryngoscope Size: Mac and 3 Grade View: Grade I Tube type: Oral Tube size: 7.0 mm Number of attempts: 1 Airway Equipment and Method: Stylet Placement Confirmation: ETT inserted through vocal cords under direct vision,  positive ETCO2 and breath sounds checked- equal and bilateral Secured at: 21 cm Tube secured with: Tape Dental Injury: Teeth and Oropharynx as per pre-operative assessment

## 2018-12-10 NOTE — Discharge Instructions (Signed)
Laparoscopic Cholecystectomy, Care After  This sheet gives you information about how to care for yourself after your procedure. Your health care provider may also give you more specific instructions. If you have problems or questions, contact your health care provider.  What can I expect after the procedure?  After the procedure, it is common to have:   Pain at your incision sites. You will be given medicines to control this pain.   Mild nausea or vomiting.    Bloating and possible shoulder pain from the air-like gas that was used during the procedure.  Follow these instructions at home:  Incision care     Follow instructions from your health care provider about how to take care of your incisions. Make sure you:  ? Wash your hands with soap and water before you change your bandage (dressing). If soap and water are not available, use hand sanitizer.  ? Change your dressing as told by your health care provider.  ? Leave stitches (sutures), skin glue, or adhesive strips in place. These skin closures may need to be in place for 2 weeks or longer. If adhesive strip edges start to loosen and curl up, you may trim the loose edges. Do not remove adhesive strips completely unless your health care provider tells you to do that.   Do not take baths, swim, or use a hot tub until your health care provider approves. Ask your health care provider if you can take showers. You may only be allowed to take sponge baths for bathing.   Check your incision area every day for signs of infection. Check for:  ? More redness, swelling, or pain.  ? More fluid or blood.  ? Warmth.  ? Pus or a bad smell.  Activity   Do not drive or use heavy machinery while taking prescription pain medicine.   Do not lift anything that is heavier than 10 lb (4.5 kg) until your health care provider approves.   Do not play contact sports until your health care provider approves.   Do not drive for 24 hours if you were given a medicine to help you relax  (sedative).   Rest as needed. Do not return to work or school until your health care provider approves.  General instructions   Take over-the-counter and prescription medicines only as told by your health care provider.   To prevent or treat constipation while you are taking prescription pain medicine, your health care provider may recommend that you:  ? Drink enough fluid to keep your urine clear or pale yellow.  ? Take over-the-counter or prescription medicines.  ? Eat foods that are high in fiber, such as fresh fruits and vegetables, whole grains, and beans.  ? Limit foods that are high in fat and processed sugars, such as fried and sweet foods.  Contact a health care provider if:   You develop a rash.   You have more redness, swelling, or pain around your incisions.   You have more fluid or blood coming from your incisions.   Your incisions feel warm to the touch.   You have pus or a bad smell coming from your incisions.   You have a fever.   One or more of your incisions breaks open.  Get help right away if:   You have trouble breathing.   You have chest pain.   You have increasing pain in your shoulders.   You faint or feel dizzy when you stand.   You have   severe pain in your abdomen.   You have nausea or vomiting that lasts for more than one day.   You have leg pain.  This information is not intended to replace advice given to you by your health care provider. Make sure you discuss any questions you have with your health care provider.  Document Released: 09/12/2005 Document Revised: 04/02/2016 Document Reviewed: 02/29/2016  Elsevier Interactive Patient Education  2019 Elsevier Inc[employer]

## 2018-12-10 NOTE — Op Note (Signed)
Patient:  Sheila Jenkins  DOB:  07-27-1991  MRN:  [id number]    Preop Diagnosis: Biliary colic, cholelithiasis  Postop Diagnosis: Same  Procedure: Laparoscopic cholecystectomy  Surgeon: [name], MD  Assistant: Larae Grooms, MD  Anes: General endotracheal  Indications: Patient is a 28 year old white female who presents with biliary colic secondary to cholelithiasis.  The risks and benefits of the procedure including bleeding, infection, hepatobiliary injury, and the possibility of an open procedure were fully explained to the patient, who gave informed consent.  Procedure note: The patient was placed in supine position.  After induction of general endotracheal anesthesia, the abdomen was prepped and draped using the usual sterile technique with ChloraPrep.  Surgical site confirmation was performed.  A supraumbilical incision was made down to the fascia.  A Veress needle was introduced into the abdominal cavity and confirmation of placement was done using the saline drop test.  The abdomen was then insufflated to 15 mmHg pressure.  An 11 mm trocar was introduced into the abdominal cavity under direct visualization without difficulty.  The patient was placed in reverse Trendelenburg position and an additional 11 mm trocar was placed in the epigastric region and 5 mm trochars were placed the right upper quadrant and right flank regions.  Liver was inspected noted to be within normal limits.  The gallbladder was retracted in a dynamic fashion in order to provide a critical view of the triangle of Calot.  The cystic duct was first identified.  Its juncture to the infundibulum was fully identified.  Endoclips were placed proximally and distally on the cystic duct, and the cystic duct was divided.  This was likewise done with the cystic artery.  The gallbladder was freed away from the gallbladder fossa using Bovie electrocautery.  The gallbladder was delivered through the epigastric trocar  site using an Endo Catch bag without difficulty.  The gallbladder fossa was inspected and no abnormal bleeding or bile leakage was noted.  Arista and Surgicel were placed in the gallbladder fossa.  All fluid and air were then evacuated from the abdominal cavity prior to removal of the trochars.  All wounds were irrigated with normal saline.  All wounds were injected with Exparel.  All incisions were closed using staples.  Betadine ointment and dry sterile dressings were applied.  All tape and needle counts were correct at the end of the procedure.  The patient was extubated in the operating room and transferred to PACU in stable condition.  Complications: None  EBL: Minimal  Specimen: Gallbladder

## 2018-12-11 ENCOUNTER — Encounter (HOSPITAL_COMMUNITY): Payer: Self-pay | Admitting: General Surgery

## 2018-12-17 ENCOUNTER — Other Ambulatory Visit: Payer: Self-pay

## 2018-12-17 ENCOUNTER — Ambulatory Visit (INDEPENDENT_AMBULATORY_CARE_PROVIDER_SITE_OTHER): Payer: Self-pay | Admitting: General Surgery

## 2018-12-17 ENCOUNTER — Encounter: Payer: Self-pay | Admitting: General Surgery

## 2018-12-17 VITALS — BP 130/91 | HR 74 | Temp 98.2°F | Resp 20 | Wt 220.4 lb

## 2018-12-17 DIAGNOSIS — Z09 Encounter for follow-up examination after completed treatment for conditions other than malignant neoplasm: Secondary | ICD-10-CM

## 2018-12-17 NOTE — Progress Notes (Signed)
Subjective:     Sheila Jenkins  Here for postoperative visit.  Feels very well.  No incisional pain.  Preoperative symptoms resolved. Objective:    BP (!) 130/91 (BP Location: Left Arm, Patient Position: Sitting, Cuff Size: Large)   Pulse 74   Temp 98.2 F (36.8 C) (Temporal)   Resp 20   Wt 220 lb 6.4 oz (100 kg)   BMI 37.83 kg/m   General:  alert, cooperative and no distress  Abdomen soft, incisions healing well.  Staples removed, Steri-Strips applied. Final pathology consistent with diagnosis.     Assessment:    Doing well postoperatively.    Plan:   May increase activity as able.  Follow-up here as needed.

## 2018-12-18 ENCOUNTER — Ambulatory Visit: Payer: Self-pay | Admitting: General Surgery

## 2019-03-11 ENCOUNTER — Telehealth: Payer: Self-pay | Admitting: Obstetrics & Gynecology

## 2019-03-11 NOTE — Telephone Encounter (Signed)
Patient called, stated that she had her baby 6 months ago, but she is still swelling so much she feels like she has a double knee cap.  Was taking water pills, but they are so expensive.  [employer]  [phone]

## 2019-03-11 NOTE — Telephone Encounter (Signed)
Pt has had swelling persistently since her delivery. She states that she weighs as much as she did when she delivered. Requesting to see Dr. Glo Herring. Scheduled for webex due to not having childcare.

## 2019-03-12 ENCOUNTER — Encounter: Payer: Self-pay | Admitting: *Deleted

## 2019-03-13 ENCOUNTER — Other Ambulatory Visit: Payer: Self-pay

## 2019-03-13 ENCOUNTER — Ambulatory Visit (INDEPENDENT_AMBULATORY_CARE_PROVIDER_SITE_OTHER): Payer: Medicaid Other | Admitting: Obstetrics and Gynecology

## 2019-03-13 ENCOUNTER — Telehealth: Payer: Self-pay | Admitting: Obstetrics and Gynecology

## 2019-03-13 ENCOUNTER — Encounter: Payer: Self-pay | Admitting: Obstetrics and Gynecology

## 2019-03-13 DIAGNOSIS — R609 Edema, unspecified: Secondary | ICD-10-CM | POA: Diagnosis not present

## 2019-03-13 NOTE — Progress Notes (Signed)
Patient ID: Sheila Jenkins, female   DOB: [birth year], 28 y.o.   MRN: [id number]     [place] VIRTUAL VIDEO VISIT ENCOUNTER NOTE  Provider location: Center for [place] [facility]   I connected with Sheila Jenkins on 03/13/2019 at 12:00 PM EDT by WebEx Encounter at home and verified that I am speaking with the correct person using two identifiers.   I discussed the limitations, risks, security and privacy concerns of performing an evaluation and management service by telephone and the availability of in person appointments. I also discussed with the patient that there may be a patient responsible charge related to this service. The patient expressed understanding and agreed to proceed.   History:  Sheila Jenkins is a 28 y.o. [phone] female being evaluated today saying that she has had persistent swelling in her body since having baby 6 months ago.  She denies any abnormal vaginal discharge, bleeding, pelvic pain or other concerns.  She reports weight ranging from 2 10-2 28 depending on whether she takes a fluid pill.  She bottle-fed, and had tubal ligation performed no history of cardiac problems or kidney problems     Past Medical History:  Diagnosis Date  . Gestational hypertension   . Hypertension   . PONV (postoperative nausea and vomiting)   . Varicose veins    Past Surgical History:  Procedure Laterality Date  . CESAREAN SECTION N/A 08/28/2018   Procedure: CESAREAN SECTION WITH BILATERAL TUBAL LIGATION;  Surgeon: Truett Mainland, DO;  Location: [place] [facility];  Service: Obstetrics;  Laterality: N/A;  . CHOLECYSTECTOMY N/A 12/10/2018   Procedure: LAPAROSCOPIC CHOLECYSTECTOMY;  Surgeon: Aviva Signs, MD;  Location: [facility];  Service: General;  Laterality: N/A;  . VARICOSE VEIN SURGERY  11/2017  . WISDOM TOOTH EXTRACTION     The following portions of the patient's history were reviewed and updated as appropriate: allergies, current medications, past  family history, past medical history, past social history, past surgical history and problem list.   Health Maintenance:  N.   Review of Systems:  Pertinent items noted in HPI and remainder of comprehensive ROS otherwise negative.  Physical Exam:   General:  Alert, oriented and cooperative. Patient appears to be in no acute distress.  Mental Status: Normal mood and affect. Normal behavior. Normal judgment and thought content.   Respiratory: Normal respiratory effort, no problems with respiration noted  Rest of physical exam deferred due to type of encounter  Labs and Imaging No results found for this or any previous visit (from the past 336 hour(s)). No results found.     Assessment and Plan:    Weight retention postpartum x6 months We will check TSH CBC c-Met and urinalysis  _0 @      I discussed the assessment and treatment plan with the patient. The patient was provided an opportunity to ask questions and all were answered. The patient agreed with the plan and demonstrated an understanding of the instructions.   The patient was advised to call back or seek an in-person evaluation/go to the ED if the symptoms worsen or if the condition fails to improve as anticipated.  I provided 10 minutes of face-to-face time during this encounter.   By signing my name below, I, Samul Dada, attest that this documentation has been prepared under the direction and in the presence of Jonnie Kind, MD. Electronically Signed: [place]. 03/13/19. 10:33 AM.  I personally performed the services described in this documentation, which was  SCRIBED in my presence. The recorded information has been reviewed and considered accurate. It has been edited as necessary during review. Jonnie Kind, MD

## 2019-03-14 LAB — COMPREHENSIVE METABOLIC PANEL
ALT: 20 IU/L (ref 0–32)
AST: 18 IU/L (ref 0–40)
Albumin/Globulin Ratio: 2.1 (ref 1.2–2.2)
Albumin: 4.9 g/dL (ref 3.9–5.0)
Alkaline Phosphatase: 67 IU/L (ref 39–117)
BUN/Creatinine Ratio: 20 (ref 9–23)
BUN: 14 mg/dL (ref 6–20)
Bilirubin Total: <0.2 mg/dL (ref 0.0–1.2)
CO2: 20 mmol/L (ref 20–29)
Calcium: 9.6 mg/dL (ref 8.7–10.2)
Chloride: 103 mmol/L (ref 96–106)
Creatinine, Ser: 0.7 mg/dL (ref 0.57–1.00)
GFR calc Af Amer: 138 mL/min/{1.73_m2} (ref >59)
GFR calc non Af Amer: 120 mL/min/{1.73_m2} (ref >59)
Globulin, Total: 2.3 g/dL (ref 1.5–4.5)
Glucose: 94 mg/dL (ref 65–99)
Potassium: 4.2 mmol/L (ref 3.5–5.2)
Sodium: 138 mmol/L (ref 134–144)
Total Protein: 7.2 g/dL (ref 6.0–8.5)

## 2019-03-14 LAB — URINALYSIS, ROUTINE W REFLEX MICROSCOPIC
Bilirubin, UA: NEGATIVE
Glucose, UA: NEGATIVE
Ketones, UA: NEGATIVE
Leukocytes,UA: NEGATIVE
Nitrite, UA: NEGATIVE
Protein,UA: NEGATIVE
RBC, UA: NEGATIVE
Specific Gravity, UA: 1.017 (ref 1.005–1.030)
Urobilinogen, Ur: 0.2 mg/dL (ref 0.2–1.0)
pH, UA: 5.5 (ref 5.0–7.5)

## 2019-03-14 LAB — CBC
Hematocrit: 39.3 % (ref 34.0–46.6)
Hemoglobin: 13.1 g/dL (ref 11.1–15.9)
MCH: 28.8 pg (ref 26.6–33.0)
MCHC: 33.3 g/dL (ref 31.5–35.7)
MCV: 86 fL (ref 79–97)
Platelets: 363 10*3/uL (ref 150–450)
RBC: 4.55 x10E6/uL (ref 3.77–5.28)
RDW: 13.4 % (ref 11.7–15.4)
WBC: 6.7 10*3/uL (ref 3.4–10.8)

## 2019-03-14 LAB — TSH: TSH: 1.16 u[IU]/mL (ref 0.450–4.500)

## 2019-03-21 ENCOUNTER — Other Ambulatory Visit: Payer: Self-pay | Admitting: Obstetrics and Gynecology

## 2019-03-21 MED ORDER — POTASSIUM CHLORIDE CRYS ER 20 MEQ PO TBCR: 40.0000 meq | EXTENDED_RELEASE_TABLET | Freq: Every day | ORAL | 1 refills | Status: DC

## 2019-03-21 MED ORDER — HYDROCHLOROTHIAZIDE 25 MG PO TABS: 25.0000 mg | ORAL_TABLET | Freq: Every day | ORAL | 0 refills | Status: DC

## 2019-03-21 NOTE — Telephone Encounter (Signed)
Unable to reach pt

## 2019-03-21 NOTE — Progress Notes (Signed)
2 week of diuretic and Kdur escribed.

## 2019-07-21 IMAGING — US US ABDOMEN LIMITED
1 series · 14 of 25 positions shown · non-contrast
Comparison: None.

CLINICAL DATA: Second trimester pregnancy with nausea, vomiting and
abdominal pain.

EXAM:
ULTRASOUND ABDOMEN LIMITED RIGHT UPPER QUADRANT

[Series 1: us abdomen limited · 0.24mm/px · 14 of 54 slices shown]
[im 1/54]
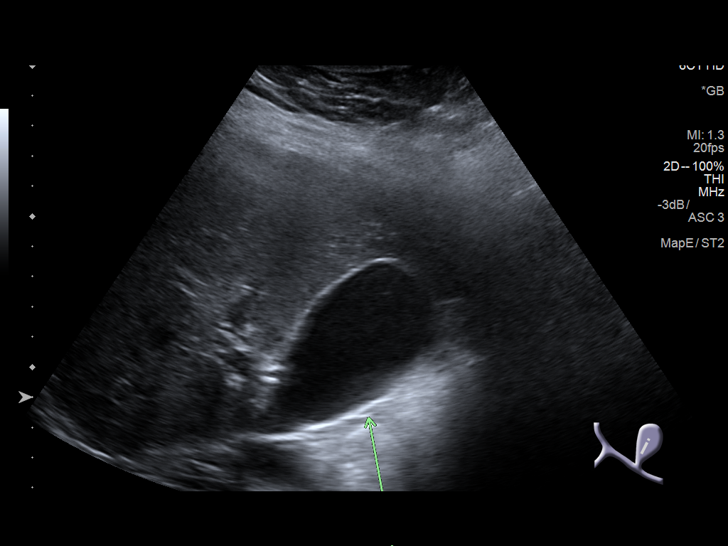
[im 5/54]
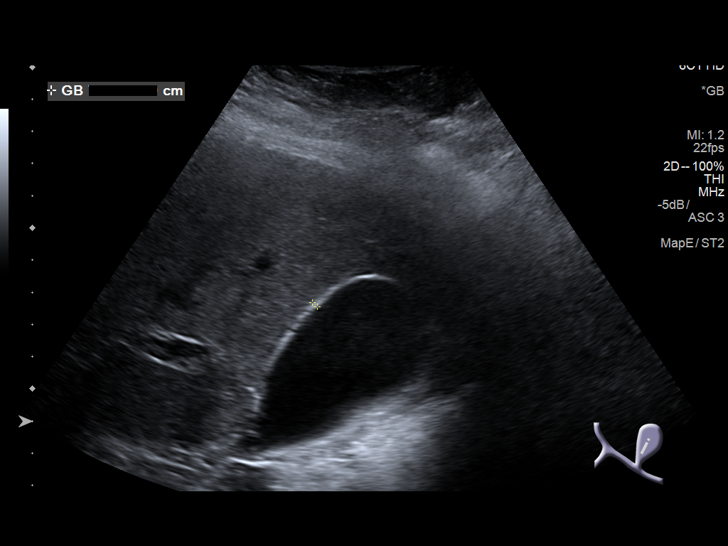
[im 9/54]
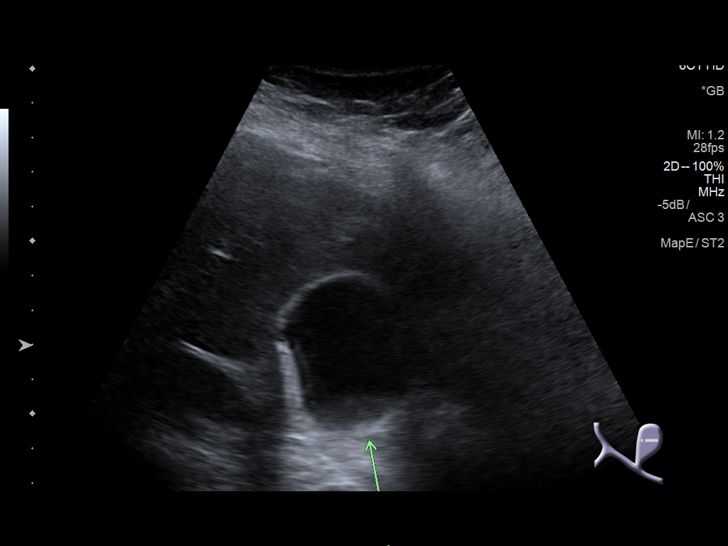
[im 14/54]
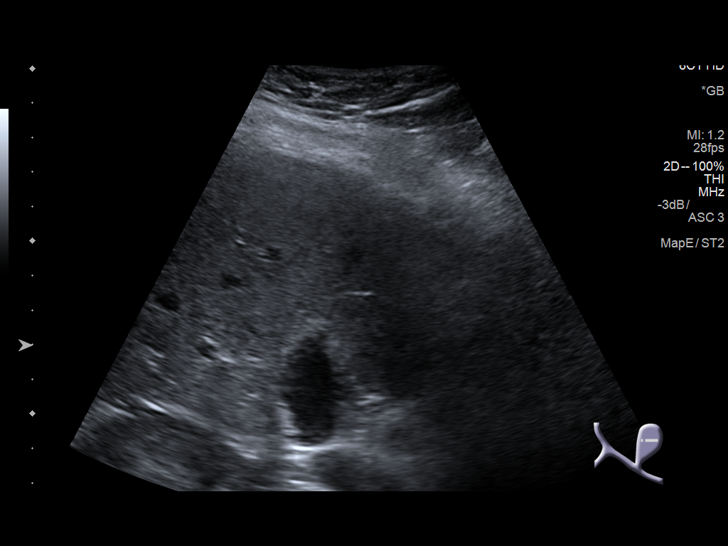
[im 18/54]
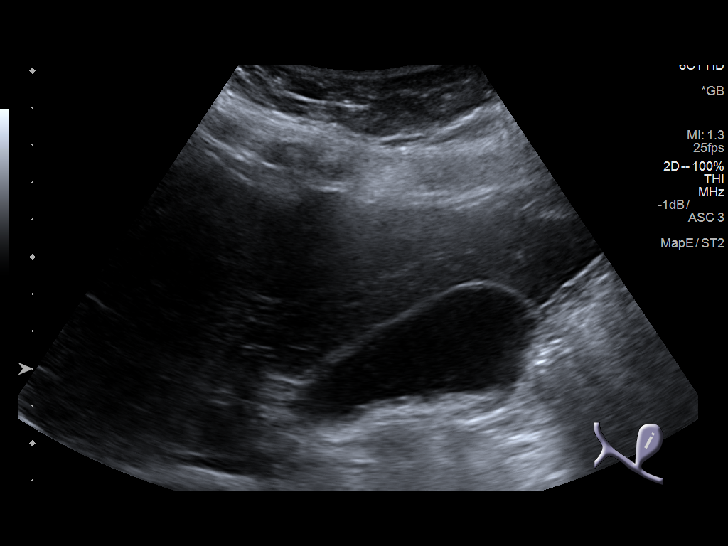
[im 20/54]
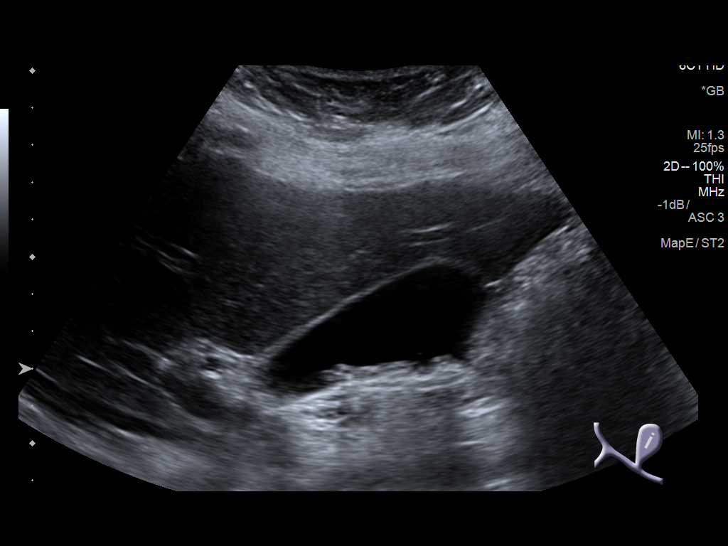
[im 25/54]
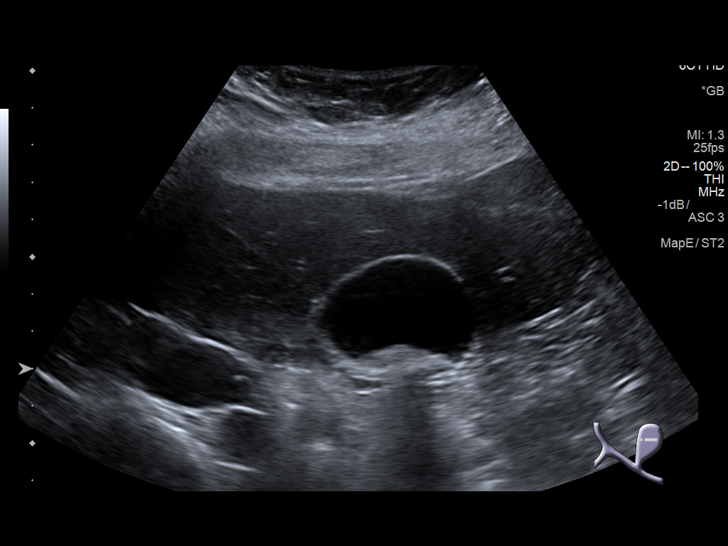
[im 29/54]
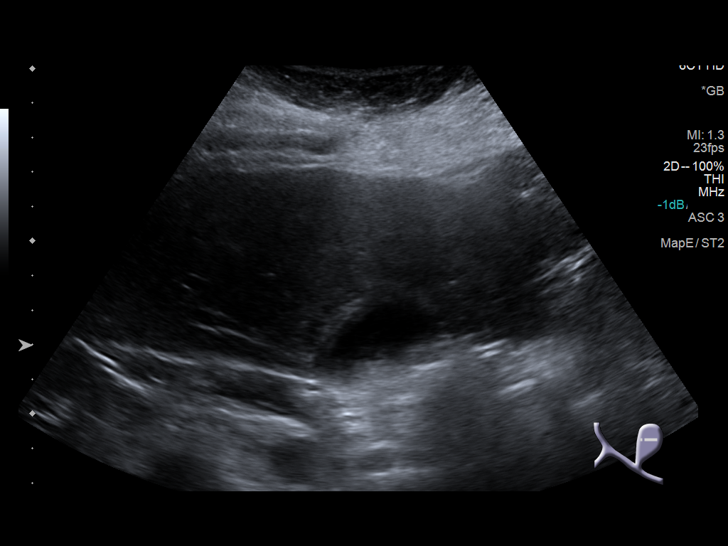
[im 34/54]
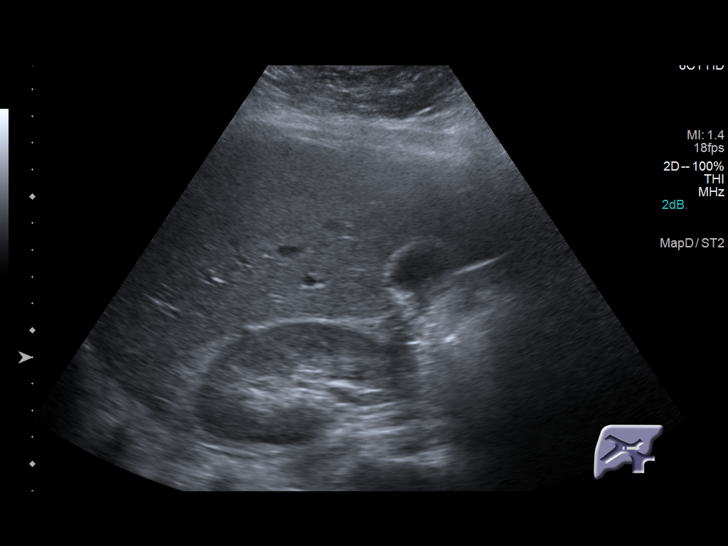
[im 36/54]
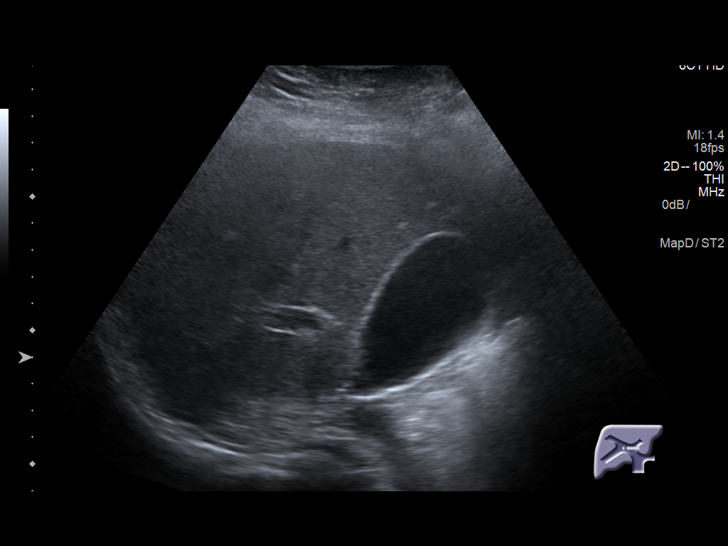
[im 40/54]
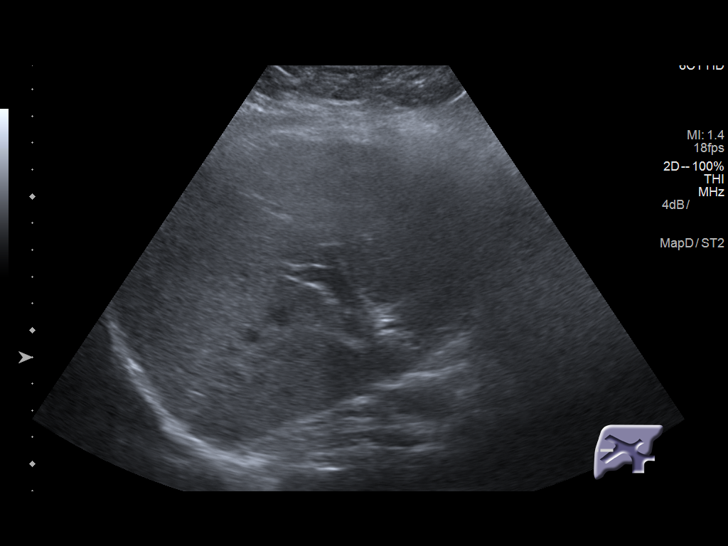
[im 45/54]
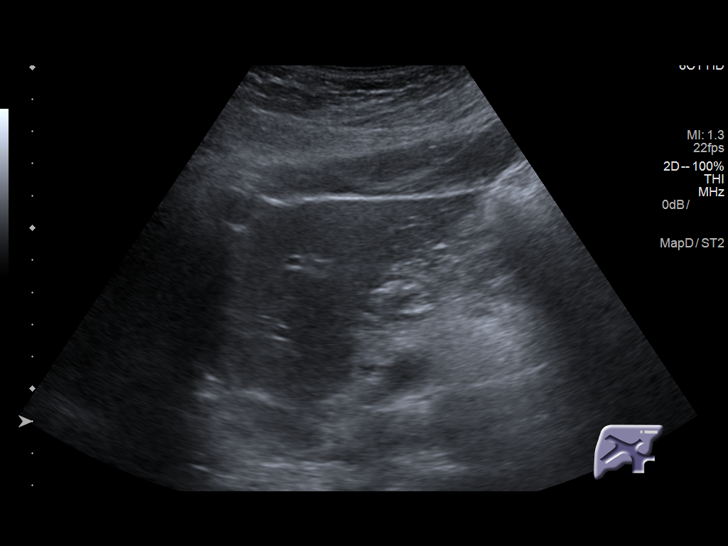
[im 49/54]
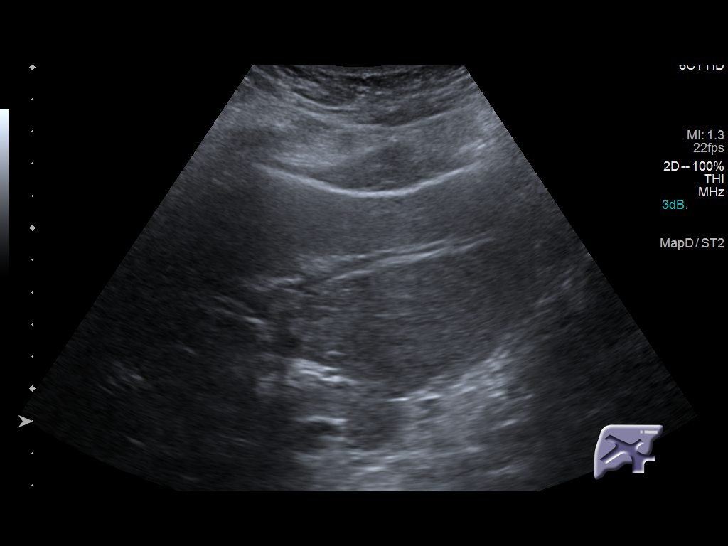
[im 54/54]
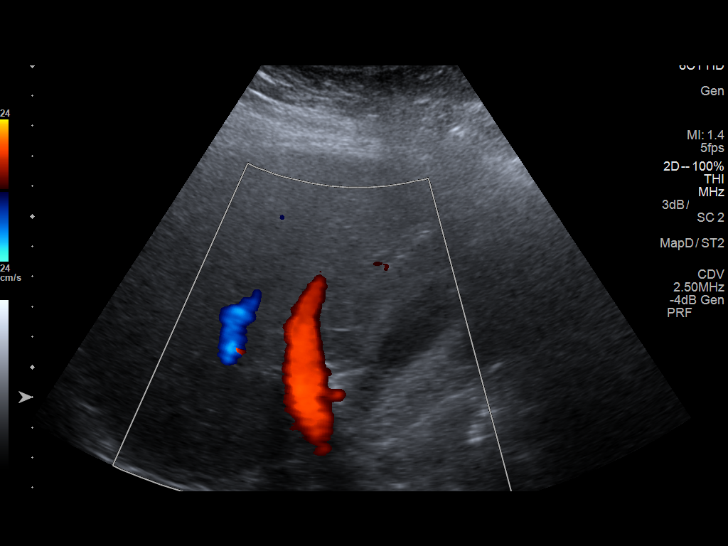

[14 of 25 positions shown; findings below may reference images not displayed]

FINDINGS: Gallbladder:

Sludge and small layering gallstones. No Murphy sign. No wall
thickening.

Common bile duct:

Diameter: 5 mm, upper limits of normal.

Liver:

No focal lesion identified. Within normal limits in parenchymal
echogenicity. Portal vein is patent on color Doppler imaging with
normal direction of blood flow towards the liver.
IMPRESSION: The gallbladder does contain some sludge and some small layering
stones. No ultrasound evidence of cholecystitis or obstruction
however.

## 2021-02-16 ENCOUNTER — Other Ambulatory Visit: Payer: Self-pay

## 2021-02-16 ENCOUNTER — Encounter (HOSPITAL_COMMUNITY): Payer: Self-pay | Admitting: *Deleted

## 2021-02-16 ENCOUNTER — Emergency Department (HOSPITAL_COMMUNITY)
Admission: EM | Admit: 2021-02-16 | Discharge: 2021-02-16 | Disposition: A | Discharge status: Home / Self Care | Payer: Medicaid Other | Attending: Emergency Medicine | Admitting: Emergency Medicine

## 2021-02-16 DIAGNOSIS — R110 Nausea: Secondary | ICD-10-CM | POA: Diagnosis not present

## 2021-02-16 DIAGNOSIS — Z79899 Other long term (current) drug therapy: Secondary | ICD-10-CM | POA: Diagnosis not present

## 2021-02-16 DIAGNOSIS — I10 Essential (primary) hypertension: Secondary | ICD-10-CM | POA: Insufficient documentation

## 2021-02-16 DIAGNOSIS — R42 Dizziness and giddiness: Secondary | ICD-10-CM | POA: Insufficient documentation

## 2021-02-16 DIAGNOSIS — L03312 Cellulitis of back [any part except buttock]: Secondary | ICD-10-CM | POA: Insufficient documentation

## 2021-02-16 DIAGNOSIS — L539 Erythematous condition, unspecified: Secondary | ICD-10-CM | POA: Diagnosis present

## 2021-02-16 DIAGNOSIS — Y990 Civilian activity done for income or pay: Secondary | ICD-10-CM | POA: Diagnosis not present

## 2021-02-16 DIAGNOSIS — L0390 Cellulitis, unspecified: Secondary | ICD-10-CM

## 2021-02-16 MED ORDER — HYDROXYZINE HCL 10 MG PO TABS: 10.0000 mg | ORAL_TABLET | ORAL | 0 refills | Status: DC | PRN

## 2021-02-16 MED ORDER — CEPHALEXIN 500 MG PO CAPS: 500.0000 mg | ORAL_CAPSULE | Freq: Four times a day (QID) | ORAL | 0 refills | Status: AC

## 2021-02-16 MED ORDER — ONDANSETRON 4 MG PO TBDP: 4.0000 mg | ORAL_TABLET | Freq: Three times a day (TID) | ORAL | 0 refills | Status: DC | PRN

## 2021-02-16 NOTE — ED Triage Notes (Signed)
Pt states she was bit by something at work on Sunday.  Pt with redness to left upper arm and back.  Pt also c/o N/V since yesterday. C/o dizziness since last night.

## 2021-02-16 NOTE — ED Provider Notes (Signed)
[facility] EMERGENCY DEPARTMENT Provider Note   CSN: [id number] Arrival date & time: 02/16/21  1117     History Chief Complaint  Patient presents with  . Insect Bite    Sheila Jenkins is a 30 y.o. female.  30 year old female with left arm redness as well as left lower back redness, with itching, onset while sitting on a sofa at work a few days ago. Pictures on phone consistent with bug bite likely now with cellulitis. Reports nausea and feeling dizzy. No fevers.         Past Medical History:  Diagnosis Date  . Gestational hypertension   . Hypertension   . PONV (postoperative nausea and vomiting)   . Varicose veins     Patient Active Problem List   Diagnosis Date Noted  . Calculus of gallbladder without cholecystitis without obstruction 04/14/2018  . Supervision of normal pregnancy 02/14/2018  . History of gestational hypertension 02/14/2018  . History of chlamydia 07/19/2017  . History of miscarriage 07/19/2017  . 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 Chronic venous insufficiency 12/18/2015    Past Surgical History:  Procedure Laterality Date  . CESAREAN SECTION N/A 08/28/2018   Procedure: CESAREAN SECTION WITH BILATERAL TUBAL LIGATION;  Surgeon: Levie Heritage, DO;  Location: [facility];  Service: Obstetrics;  Laterality: N/A;  . CHOLECYSTECTOMY N/A 12/10/2018   Procedure: LAPAROSCOPIC CHOLECYSTECTOMY;  Surgeon: [name], MD;  Location: [facility];  Service: General;  Laterality: N/A;  . VARICOSE VEIN SURGERY  11/2017  . WISDOM TOOTH EXTRACTION       OB History    Gravida  4   Para  3   Term  3   Preterm      AB  1   Living  3     SAB  1   IAB      Ectopic      Multiple  0   Live Births  3           Family History  Problem Relation Age of Onset  . Diabetes Maternal Grandmother   . Diabetes Maternal Grandfather   . Drug abuse Father   . Drug abuse Mother   . Asthma Brother   .  Suicidality Brother   . Asthma Son   . Allergies Son   . Allergies Daughter     Social History   Tobacco Use  . Smoking status: Never Smoker  . Smokeless tobacco: Never Used  Vaping Use  . Vaping Use: Never used  Substance Use Topics  . Alcohol use: No    Alcohol/week: 0.0 standard drinks  . Drug use: No    Home Medications Prior to Admission medications   Medication Sig Start Date End Date Taking? Authorizing Provider  cephALEXin (KEFLEX) 500 MG capsule Take 1 capsule (500 mg total) by mouth 4 (four) times daily for 7 days. 02/16/21 02/23/21 Yes Jeannie Fend, PA-C  diphenhydrAMINE (BENADRYL) 25 MG tablet Take 25 mg by mouth every 6 (six) hours as needed for itching or allergies.   Yes [provider]  diphenhydrAMINE-zinc acetate (BENADRYL) cream Apply 1 application topically 2 (two) times daily as needed for itching.   Yes [provider]  hydrOXYzine (ATARAX/VISTARIL) 10 MG tablet Take 1 tablet (10 mg total) by mouth every 4 (four) hours as needed for itching. 02/16/21  Yes Army Melia A, PA-C  ondansetron (ZOFRAN ODT) 4 MG disintegrating tablet Take 1 tablet (4 mg total) by  mouth every 8 (eight) hours as needed for nausea or vomiting. 02/16/21  Yes Jeannie Fend, PA-C  hydrochlorothiazide (HYDRODIURIL) 25 MG tablet Take 1 tablet (25 mg total) by mouth daily. Patient not taking: Reported on 02/16/2021 03/21/19   Tilda Burrow, MD  potassium chloride SA (K-DUR) 20 MEQ tablet Take 2 tablets (40 mEq total) by mouth daily. Patient not taking: Reported on 02/16/2021 03/21/19   Tilda Burrow, MD    Allergies    Patient has no known allergies.  Review of Systems   Review of Systems  Constitutional: Negative for fever.  Respiratory: Negative for shortness of breath.   Cardiovascular: Negative for chest pain.  Gastrointestinal: Positive for nausea and vomiting.  Musculoskeletal: Negative for arthralgias and myalgias.  Skin: Positive for rash.   Allergic/Immunologic: Negative for immunocompromised state.  Neurological: Positive for dizziness. Negative for weakness.  Hematological: Negative for adenopathy.  Psychiatric/Behavioral: Negative for confusion.  All other systems reviewed and are negative.   Physical Exam Updated Vital Signs BP (!) 164/104 (BP Location: Right Arm)   Pulse 70   Temp 98.6 F (37 C) (Oral)   Resp 16   Ht 5\' 4"  (1.626 m)   Wt 99.8 kg   LMP 01/25/2021   SpO2 100%   BMI 37.76 kg/m   Physical Exam Vitals and nursing note reviewed.  Constitutional:      General: She is not in acute distress.    Appearance: She is well-developed. She is not diaphoretic.  HENT:     Head: Normocephalic and atraumatic.     Mouth/Throat:     Mouth: Mucous membranes are moist.  Eyes:     Conjunctiva/sclera: Conjunctivae normal.  Cardiovascular:     Pulses: Normal pulses.  Pulmonary:     Effort: Pulmonary effort is normal.  Musculoskeletal:        General: No swelling, tenderness, deformity or signs of injury.     Cervical back: Neck supple.  Skin:    Findings: Erythema present.       Neurological:     Mental Status: She is alert and oriented to person, place, and time.     Sensory: No sensory deficit.  Psychiatric:        Behavior: Behavior normal.     ED Results / Procedures / Treatments   Labs (all labs ordered are listed, but only abnormal results are displayed) Labs Reviewed - No data to display  EKG None  Radiology No results found.  Procedures Procedures   Medications Ordered in ED Medications - No data to display  ED Course  I have reviewed the triage vital signs and the nursing notes.  Pertinent labs & imaging results that were available during my care of the patient were reviewed by me and considered in my medical decision making (see chart for details).  Clinical Course as of 02/16/21 1319  Tue Feb 16, 2021  7488 30 year old female with cellulitis to left upper arm as well as  left low back likely secondary to insect bite.  Patient was hypertensive on arrival in triage normotensive on recheck with blood pressure 120s over 80s.  O2 sat on room air 100%. Plan is to treat with Keflex, given hydroxyzine for her itching and Zofran for nausea complaint.  Recommend recheck with Worker's Comp. provider in 2 days, return for worsening or concerning symptoms. [LM]    Clinical Course User Index [LM] 26   MDM Rules/Calculators/A&P  Final Clinical Impression(s) / ED Diagnoses Final diagnoses:  Cellulitis, unspecified cellulitis site    Rx / DC Orders ED Discharge Orders         Ordered    cephALEXin (KEFLEX) 500 MG capsule  4 times daily        02/16/21 1316    ondansetron (ZOFRAN ODT) 4 MG disintegrating tablet  Every 8 hours PRN        02/16/21 1316    hydrOXYzine (ATARAX/VISTARIL) 10 MG tablet  Every 4 hours PRN        02/16/21 1316           Alden Hipp 02/16/21 1319    Vanetta Mulders, MD 02/20/21 [phone]

## 2021-02-16 NOTE — Discharge Instructions (Signed)
Take Keflex as prescribed and complete the full course.  You can take hydroxyzine as needed as prescribed for itching (do not take additional oral or topical Benadryl).  Take Zofran as needed as prescribed for nausea. Recheck with your Worker's Comp. provider in 2 days if not improving.

## 2022-01-06 ENCOUNTER — Ambulatory Visit (INDEPENDENT_AMBULATORY_CARE_PROVIDER_SITE_OTHER): Payer: 59 | Admitting: Obstetrics & Gynecology

## 2022-01-06 ENCOUNTER — Other Ambulatory Visit (HOSPITAL_COMMUNITY)
Admission: RE | Admit: 2022-01-06 | Discharge: 2022-01-06 | Disposition: A | Discharge status: Home / Self Care | Payer: 59 | Source: Ambulatory Visit | Attending: Obstetrics & Gynecology | Admitting: Obstetrics & Gynecology

## 2022-01-06 ENCOUNTER — Encounter: Payer: Self-pay | Admitting: Obstetrics & Gynecology

## 2022-01-06 VITALS — BP 138/87 | HR 73 | Ht 64.0 in | Wt 261.4 lb

## 2022-01-06 DIAGNOSIS — F419 Anxiety disorder, unspecified: Secondary | ICD-10-CM | POA: Diagnosis not present

## 2022-01-06 DIAGNOSIS — Z6841 Body Mass Index (BMI) 40.0 and over, adult: Secondary | ICD-10-CM

## 2022-01-06 DIAGNOSIS — Z01419 Encounter for gynecological examination (general) (routine) without abnormal findings: Secondary | ICD-10-CM | POA: Insufficient documentation

## 2022-01-06 DIAGNOSIS — F32A Depression, unspecified: Secondary | ICD-10-CM

## 2022-01-06 DIAGNOSIS — N938 Other specified abnormal uterine and vaginal bleeding: Secondary | ICD-10-CM

## 2022-01-06 DIAGNOSIS — E282 Polycystic ovarian syndrome: Secondary | ICD-10-CM

## 2022-01-06 MED ORDER — DROSPIRENONE-ETHINYL ESTRADIOL 3-0.03 MG PO TABS: 1.0000 | ORAL_TABLET | Freq: Every day | ORAL | 4 refills | Status: AC

## 2022-01-06 NOTE — Progress Notes (Signed)
? ?WELL-WOMAN EXAMINATION ?Patient name: Sheila Jenkins MRN [id number]  Date of birth: Jan 04, 1991 ?Chief Complaint:   ?New Patient (Initial Visit) and Gynecologic Exam ? ?History of Present Illness:   ?Sheila Jenkins is a 31 y.o. [phone] female being seen today for a routine well-woman exam and the following concerns: ? ?AUB: Issues with her period for the past year.  Menses irregular- sometimes will skip a month other times menses will be more frequent.  Sometimes its spotting, sometimes it can be moderate to heavy and last for more than a week. ? ?Also notes that "something is wrong" - reports hair loss, acne, weight gain, changes in her sleep and other symptoms including LE swelling.  She also is concerned about depression and whether this is a component. ? ?Pt seemed frustrated about her weight- exercising 5x per week x , feels like she is eating healthy and has not seen much change. ? ?Pt states she was seen at [facility]- lab work completed- no thyroid, no DM and everything else was normal. ? ? ?Patient's last menstrual period was 12/12/2021 (exact date). ?Denies issues with her menses ?The current method of family planning is tubal ligation.  ? ? ?Last pap several years ago.  ?Last mammogram: n/a. ?Last colonoscopy: n/a ? ? ?  01/06/2022  ?  9:03 AM 02/14/2018  ? 10:34 AM 08/21/2017  ? 11:39 AM 07/21/2017  ? 11:54 AM  ?Depression screen PHQ 2/9  ?Decreased Interest 3 1 0 0  ?Down, Depressed, Hopeless 3 0 0 0  ?PHQ - 2 Score 6 1 0 0  ?Altered sleeping 3 1    ?Tired, decreased energy 3 2    ?Change in appetite 3 0    ?Feeling bad or failure about yourself  3 0    ?Trouble concentrating 3 0    ?Moving slowly or fidgety/restless 0 0    ?Suicidal thoughts 0 0    ?PHQ-9 Score 21 4    ? ? ? ? ?Review of Systems:   ?Pertinent items are noted in HPI ?Denies any headaches, blurred vision, fatigue, shortness of breath, chest pain, abdominal pain, bowel movements, urination, or intercourse unless  otherwise stated above. ? ?Pertinent History Reviewed:  ?Reviewed past medical,surgical, social and family history.  ?Reviewed problem list, medications and allergies. ?Physical Assessment:  ? ?Vitals:  ? 01/06/22 0914  ?BP: 138/87  ?Pulse: 73  ?Weight: 261 lb 6.4 oz (118.6 kg)  ?Height: 5\' 4"  (1.626 m)  ?Body mass index is 44.87 kg/m?. ?  ?     Physical Examination:  ? General appearance - well appearing, and in no distress ? Mental status - alert, oriented to person, place, and time ? Psych:  frustruated ? Skin - warm and dry, normal color, facial acne noted ? Chest - effort normal, all lung fields clear to auscultation bilaterally ? Heart - normal rate and regular rhythm ? Neck:  midline trachea, no thyromegaly or nodules ? Breasts - breasts appear normal, no suspicious masses, no skin or nipple changes or  axillary nodes ? Abdomen - obese, soft, nontender, nondistended, no masses or organomegaly ? Pelvic - VULVA: normal appearing vulva with no masses, tenderness or lesions  VAGINA: normal appearing vagina with normal color and discharge, no lesions  CERVIX: normal appearing cervix without discharge or lesions, no CMT ? Thin prep pap is done with HR HPV cotesting ? UTERUS: uterus is felt to be normal size, shape, consistency and nontender  ? ADNEXA: No adnexal masses  or tenderness noted. ? Extremities:  No calf tenderness bilaterally, no edema ? ?Chaperone: Clint Bolder   ? ? ?Assessment & Plan:  ?1) Well-Woman Exam ?-pap collected reviewed guidelines ? ?2) PCOS ?-based on history, she meets [place] criteria ?-discussed diagnosis, symptoms and management ?-recommended OTC inositol  ?-plan to start OCP daily ? ?OCP risk assessment: Pt denies personal history of VTE, stroke or heart attack.  Denies personal h/o breast cancer.  Pt is either a non-smoker or smoker under the age of 29yo.  Denies h/o migraines with aura ? ?3) Weight management ?-referral created to [facility] in [place] ?-discussion  regarding options and also that there is  not a "magic diet" that will work  ?-encouraged patience, acceptance and understanding that change would not happen overnight but that it sounded like she was on the right track and making steps to improve her health ? ?4) Anxiety/Depression ?-encouraged pt to consider counseling ?-may also consider medical management in the future once she has established OCP use and is being seen for wellness ? ?Meds:  ?Meds ordered this encounter  ?Medications  ? drospirenone-ethinyl estradiol (YASMIN 28) 3-0.03 MG tablet  ?  Sig: Take 1 tablet by mouth daily.  ?  Dispense:  90 tablet  ?  Refill:  4  ? ? ?Follow-up: Return in about 3 months (around 04/07/2022) for 4wk RN visit for BP check and then 2-50mos follow up with Nilay Mangrum. ? ? ?Myna Hidalgo, DO ?Attending Obstetrician & Gynecologist, Faculty Practice ?[facility] for [employer], [facility] ? ? ?

## 2022-01-06 NOTE — Addendum Note (Signed)
Addended by: Moss Mc on: 01/06/2022 01:58 PM ? ? Modules accepted: Orders ? ?

## 2022-01-10 ENCOUNTER — Other Ambulatory Visit: Payer: Self-pay | Admitting: *Deleted

## 2022-01-10 DIAGNOSIS — Z6841 Body Mass Index (BMI) 40.0 and over, adult: Secondary | ICD-10-CM

## 2022-01-11 LAB — CYTOLOGY - PAP
Chlamydia: NEGATIVE
Comment: NEGATIVE
Comment: NEGATIVE
Comment: NEGATIVE
Comment: NEGATIVE
Comment: NORMAL
Diagnosis: UNDETERMINED — AB
HPV 16: NEGATIVE
HPV 18 / 45: NEGATIVE
High risk HPV: POSITIVE — AB
Neisseria Gonorrhea: NEGATIVE

## 2022-01-12 ENCOUNTER — Telehealth: Payer: Self-pay

## 2022-01-12 ENCOUNTER — Encounter: Payer: Self-pay | Admitting: Obstetrics & Gynecology

## 2022-01-12 NOTE — Telephone Encounter (Signed)
PT CALLED AND STATED THAT SHE WANTS TO TALK TO SOMEONE ABOUT HER TEST RESULTS. ?

## 2022-01-19 ENCOUNTER — Ambulatory Visit (INDEPENDENT_AMBULATORY_CARE_PROVIDER_SITE_OTHER): Payer: 59 | Admitting: Obstetrics and Gynecology

## 2022-01-19 ENCOUNTER — Encounter: Payer: Self-pay | Admitting: Obstetrics and Gynecology

## 2022-01-19 ENCOUNTER — Other Ambulatory Visit (HOSPITAL_COMMUNITY)
Admission: RE | Admit: 2022-01-19 | Discharge: 2022-01-19 | Disposition: A | Discharge status: Home / Self Care | Payer: 59 | Source: Ambulatory Visit | Attending: Medical | Admitting: Medical

## 2022-01-19 VITALS — BP 140/86 | HR 70 | Wt 264.2 lb

## 2022-01-19 DIAGNOSIS — R87618 Other abnormal cytological findings on specimens from cervix uteri: Secondary | ICD-10-CM

## 2022-01-19 DIAGNOSIS — E282 Polycystic ovarian syndrome: Secondary | ICD-10-CM | POA: Diagnosis not present

## 2022-01-19 DIAGNOSIS — Z3202 Encounter for pregnancy test, result negative: Secondary | ICD-10-CM

## 2022-01-19 LAB — POCT URINE PREGNANCY: Preg Test, Ur: NEGATIVE

## 2022-01-19 MED ORDER — METFORMIN HCL 500 MG PO TABS: 500.0000 mg | ORAL_TABLET | Freq: Two times a day (BID) | ORAL | 5 refills | Status: AC

## 2022-01-19 NOTE — Progress Notes (Signed)
? ? ?  GYNECOLOGY OFFICE COLPOSCOPY PROCEDURE NOTE ? ?31 y.o. [id number] here for colposcopy for ASCUS with POSITIVE high risk HPV pap smear on 01/06/2022.  ? ?Pregnancy test:  negative ?Gardasil:  completed. Discussed role for HPV in cervical dysplasia, need for surveillance. ? ?Informed consent and review of risks, benefit and alternatives performed. Written consent given.  ? ?Speculum inserted into patient's vagina assuring full view of cervix and vaginal walls. 3 swabs of vinegar solution applied to the cervix and vaginal walls and colposcope was used to observe both the cervix and vaginal walls.  ? ?Colposcopy adequate? Yes ?no visible lesions, no mosaicism, no punctation, and no abnormal vasculature;  ECC collected. ? ?All specimens were labeled and sent to pathology. ? ?Speculum removed.  ?Pt tolerated well with minimal pain and bleeding.  ? ?Patient was given post procedure instructions.  Will follow up pathology and manage accordingly; patient will be contacted with results and recommendations.  Routine preventative health maintenance measures emphasized. ? ?We also discussed diagnosis of PCOS and purpose of OCPs - protection from hyperplasia, treatment of hairloss/acne and other symptoms of hyperandrogenism. She also reports mood swings and reviewed OCPs can help with this as well. Reviewed inositol in PCOS that It helps some and doesn't help others. We also discussed some patients find benefit from metformin. Rx sent as she would like to start this. She doesn't feel like she can go to weight loss clinic due to her work. She is working on her diet and exercise. She has trouble with weight loss despite these efforts - we reviewed this is common with PCOS. She may also wish to see dermatologist - she would like to see who her insurance covers and then she will call us for referral.  ?She reports HPV pos is surprise for her - same partner for 13 years, no prior abnormals and has had gardasil. Reviewed possible  false positive - I have seen this in the past. Likely will recommend cotest in 12 and 24 months pending ECC pathology ?Reviewed low sex drive prior to OCPs - discussed multifactorial for women and in her case she has had changes in her skin, hair loss and weight gain, all of which may impact her sex drive. Reviewed limited options for helping sex drive but that after sometime on the OCP we can re-address this to see if it improves as other symptoms improve.  ? ?Milas Hock, MD, FACOG ?Obstetrician Heritage manager, Faculty Practice ?[facility] for [employer], [facility] ? ? ? ? ? ? ?

## 2022-01-21 LAB — SURGICAL PATHOLOGY

## 2022-02-03 ENCOUNTER — Ambulatory Visit: Payer: 59

## 2022-06-09 ENCOUNTER — Ambulatory Visit: Payer: 59 | Admitting: Internal Medicine

## 2022-08-11 ENCOUNTER — Ambulatory Visit: Payer: 59 | Admitting: Family Medicine

## 2022-08-11 DIAGNOSIS — E559 Vitamin D deficiency, unspecified: Secondary | ICD-10-CM

## 2022-08-11 DIAGNOSIS — Z0000 Encounter for general adult medical examination without abnormal findings: Secondary | ICD-10-CM
